# Patient Record
Sex: Male | Born: 1980 | Race: Black or African American | Hispanic: No | Marital: Single | Smoking: Current every day smoker
Health system: Southern US, Community
[De-identification: ages and names within clinical notes are randomized; demographics above are authoritative.]

## PROBLEM LIST (undated history)

## (undated) ENCOUNTER — Emergency Department: Payer: MEDICAID

## (undated) DIAGNOSIS — J45909 Unspecified asthma, uncomplicated: Secondary | ICD-10-CM

## (undated) HISTORY — PX: NO PAST SURGERIES: SHX2092

---

## 2011-02-28 ENCOUNTER — Emergency Department: Payer: Self-pay | Admitting: *Deleted

## 2011-05-25 ENCOUNTER — Emergency Department: Payer: Self-pay | Admitting: Emergency Medicine

## 2012-04-15 ENCOUNTER — Emergency Department: Payer: Self-pay | Admitting: Emergency Medicine

## 2012-11-13 ENCOUNTER — Emergency Department: Payer: Self-pay | Admitting: Emergency Medicine

## 2012-12-22 ENCOUNTER — Emergency Department: Payer: Self-pay | Admitting: Emergency Medicine

## 2013-06-08 ENCOUNTER — Emergency Department: Payer: Self-pay | Admitting: Emergency Medicine

## 2014-09-05 ENCOUNTER — Emergency Department
Admission: EM | Admit: 2014-09-05 | Discharge: 2014-09-05 | Disposition: A | Discharge status: Home / Self Care | Payer: Self-pay | Attending: Emergency Medicine | Admitting: Emergency Medicine

## 2014-09-05 ENCOUNTER — Emergency Department: Payer: Self-pay

## 2014-09-05 ENCOUNTER — Encounter: Payer: Self-pay | Admitting: Emergency Medicine

## 2014-09-05 DIAGNOSIS — Y9289 Other specified places as the place of occurrence of the external cause: Secondary | ICD-10-CM | POA: Insufficient documentation

## 2014-09-05 DIAGNOSIS — M25552 Pain in left hip: Secondary | ICD-10-CM

## 2014-09-05 DIAGNOSIS — W109XXA Fall (on) (from) unspecified stairs and steps, initial encounter: Secondary | ICD-10-CM | POA: Insufficient documentation

## 2014-09-05 DIAGNOSIS — M79605 Pain in left leg: Secondary | ICD-10-CM

## 2014-09-05 DIAGNOSIS — Z720 Tobacco use: Secondary | ICD-10-CM | POA: Insufficient documentation

## 2014-09-05 DIAGNOSIS — Y998 Other external cause status: Secondary | ICD-10-CM | POA: Insufficient documentation

## 2014-09-05 DIAGNOSIS — Y9389 Activity, other specified: Secondary | ICD-10-CM | POA: Insufficient documentation

## 2014-09-05 DIAGNOSIS — S79912A Unspecified injury of left hip, initial encounter: Secondary | ICD-10-CM | POA: Insufficient documentation

## 2014-09-05 MED ORDER — TIZANIDINE HCL 4 MG PO TABS: 4.0000 mg | ORAL_TABLET | Freq: Three times a day (TID) | ORAL | Status: AC

## 2014-09-05 MED ORDER — TRAMADOL HCL 50 MG PO TABS: 50.0000 mg | ORAL_TABLET | Freq: Four times a day (QID) | ORAL | Status: DC | PRN

## 2014-09-05 MED ORDER — KETOROLAC TROMETHAMINE 60 MG/2ML IM SOLN
60.0000 mg | Freq: Once | INTRAMUSCULAR | Status: AC
  Administered 2014-09-05: 60 mg via INTRAMUSCULAR
  Filled 2014-09-05: qty 2

## 2014-09-05 MED ORDER — KETOROLAC TROMETHAMINE 10 MG PO TABS: 10.0000 mg | ORAL_TABLET | Freq: Four times a day (QID) | ORAL | Status: DC | PRN

## 2014-09-05 NOTE — ED Provider Notes (Signed)
[facility] Emergency Department Provider Note ____________________________________________  Time seen: Approximately 9:00 AM  I have reviewed the triage vital signs and the nursing notes.   HISTORY  Chief Complaint Fall   HPI Ryan Beck is a 34 y.o. male who presents to the ER for evaluation of left hip and leg pain post fall. He slipped on a wet step while wearing flip flops. No loss of consciousness. Able to ambulate after the fall and drove himself here.  History reviewed. No pertinent past medical history.  There are no active problems to display for this patient.   History reviewed. No pertinent past surgical history.  No current outpatient prescriptions on file.  Allergies Shellfish allergy  No family history on file.  Social History Social History  Substance Use Topics  . Smoking status: Current Every Day Smoker  . Smokeless tobacco: None  . Alcohol Use: No    Review of Systems Constitutional: No recent illness. Eyes: No visual changes. ENT: No sore throat. Cardiovascular: Denies chest pain or palpitations. Respiratory: Denies shortness of breath. Gastrointestinal: No abdominal pain.  Genitourinary: Negative for dysuria. Musculoskeletal: Pain in left hip with radiation into the left lateral leg. Skin: Negative for rash. Neurological: Negative for headaches, focal weakness or numbness. 10-point ROS otherwise negative.  ____________________________________________   PHYSICAL EXAM:  VITAL SIGNS: ED Triage Vitals  Enc Vitals Group     BP 09/05/14 0833 164/108 mmHg     Pulse Rate 09/05/14 0833 77     Resp 09/05/14 0833 20     Temp 09/05/14 0833 98.4 F (36.9 C)     Temp Source 09/05/14 0833 Oral     SpO2 09/05/14 0833 98 %     Weight 09/05/14 0833 285 lb (129.275 kg)     Height 09/05/14 0833  (1.702 m)     Head Cir --      Peak Flow --      Pain Score 09/05/14 0834 8     Pain Loc --      Pain Edu? --    Excl. in GC? --     Constitutional: Alert and oriented. Well appearing and in no acute distress. Eyes: Conjunctivae are normal. EOMI. Head: Atraumatic. Nose: No congestion/rhinnorhea. Neck: No stridor.  Respiratory: Normal respiratory effort.   Musculoskeletal: left hip pain with any movement; no shortening or rotation. Neurologic:  Normal speech and language. No gross focal neurologic deficits are appreciated. Speech is normal. No gait instability. Skin:  Skin is warm, dry and intact. Atraumatic. Psychiatric: Mood and affect are normal. Speech and behavior are normal.  ____________________________________________   LABS (all labs ordered are listed, but only abnormal results are displayed)  Labs Reviewed - No data to display ____________________________________________  RADIOLOGY  Left hip negative for acute abnormality.  ____________________________________________   PROCEDURES  Procedure(s) performed: None   ____________________________________________   INITIAL IMPRESSION / ASSESSMENT AND PLAN / ED COURSE  Pertinent labs & imaging results that were available during my care of the patient were reviewed by me and considered in my medical decision making (see chart for details).  IM Toradol given in the ER with some relief. He is to follow up with orthopedics for symptoms that are not improving over the week or return to the ER for symptoms that change or worsen. ____________________________________________   FINAL CLINICAL IMPRESSION(S) / ED DIAGNOSES  Final diagnoses:  Hip pain, acute, left       Chinita Pester, FNP 09/05/14 1051  Sharyn Creamer, MD 09/05/14 [phone]

## 2014-09-05 NOTE — ED Notes (Signed)
Pt states he slipped in a puddle of water and fell this morning.the patient c/o left hip/leg/lower back pain.the patient is ambulatory to triage without difficulty

## 2014-09-05 NOTE — ED Notes (Signed)
States he slipped down steps  Having pain to left hip and back

## 2014-12-29 ENCOUNTER — Emergency Department
Admission: EM | Admit: 2014-12-29 | Discharge: 2014-12-29 | Disposition: A | Discharge status: Home / Self Care | Payer: Self-pay | Attending: Emergency Medicine | Admitting: Emergency Medicine

## 2014-12-29 ENCOUNTER — Emergency Department: Payer: Self-pay

## 2014-12-29 DIAGNOSIS — F17200 Nicotine dependence, unspecified, uncomplicated: Secondary | ICD-10-CM | POA: Insufficient documentation

## 2014-12-29 DIAGNOSIS — Z79899 Other long term (current) drug therapy: Secondary | ICD-10-CM | POA: Insufficient documentation

## 2014-12-29 DIAGNOSIS — J45901 Unspecified asthma with (acute) exacerbation: Secondary | ICD-10-CM | POA: Insufficient documentation

## 2014-12-29 HISTORY — DX: Unspecified asthma, uncomplicated: J45.909

## 2014-12-29 MED ORDER — PREDNISONE 20 MG PO TABS
60.0000 mg | ORAL_TABLET | Freq: Once | ORAL | Status: AC
  Administered 2014-12-29: 60 mg via ORAL

## 2014-12-29 MED ORDER — PREDNISONE 20 MG PO TABS: 60.0000 mg | ORAL_TABLET | Freq: Every day | ORAL | Status: AC

## 2014-12-29 MED ORDER — PREDNISONE 20 MG PO TABS
ORAL_TABLET | ORAL | Status: AC
  Administered 2014-12-29: 60 mg via ORAL
  Filled 2014-12-29: qty 3

## 2014-12-29 MED ORDER — IPRATROPIUM-ALBUTEROL 0.5-2.5 (3) MG/3ML IN SOLN
RESPIRATORY_TRACT | Status: AC
  Administered 2014-12-29: 3 mL
  Filled 2014-12-29: qty 3

## 2014-12-29 MED ORDER — IPRATROPIUM-ALBUTEROL 0.5-2.5 (3) MG/3ML IN SOLN
RESPIRATORY_TRACT | Status: AC
  Administered 2014-12-29: 3 mL via RESPIRATORY_TRACT
  Filled 2014-12-29: qty 6

## 2014-12-29 MED ORDER — ALBUTEROL SULFATE HFA 108 (90 BASE) MCG/ACT IN AERS: 2.0000 | INHALATION_SPRAY | Freq: Four times a day (QID) | RESPIRATORY_TRACT | Status: DC | PRN

## 2014-12-29 MED ORDER — IPRATROPIUM-ALBUTEROL 0.5-2.5 (3) MG/3ML IN SOLN
3.0000 mL | Freq: Once | RESPIRATORY_TRACT | Status: AC
  Administered 2014-12-29: 3 mL via RESPIRATORY_TRACT
  Filled 2014-12-29: qty 3

## 2014-12-29 NOTE — ED Notes (Signed)
Pt states hx of asthma, SOB with excertion, cough and congestion, pt SOB ambulating to room , 02 sat 97% on RA

## 2014-12-29 NOTE — ED Notes (Signed)
White phlegm, SOB, chest pain that began this AM. Pt alert and oriented X4, active, cooperative, pt in NAD. RR even and unlabored, color WNL.

## 2014-12-29 NOTE — ED Notes (Signed)
Pt refuses blood work at this time

## 2014-12-29 NOTE — Discharge Instructions (Signed)
Asthma, Adult °Asthma is a recurring condition in which the airways tighten and narrow. Asthma can make it difficult to breathe. It can cause coughing, wheezing, and shortness of breath. Asthma episodes, also called asthma attacks, range from minor to life-threatening. Asthma cannot be cured, but medicines and lifestyle changes can help control it. °CAUSES °Asthma is believed to be caused by inherited (genetic) and environmental factors, but its exact cause is unknown. Asthma may be triggered by allergens, lung infections, or irritants in the air. Asthma triggers are different for each person. Common triggers include:  °· Animal dander. °· Dust mites. °· Cockroaches. °· Pollen from trees or grass. °· Mold. °· Smoke. °· Air pollutants such as dust, household cleaners, hair sprays, aerosol sprays, paint fumes, strong chemicals, or strong odors. °· Cold air, weather changes, and winds (which increase molds and pollens in the air). °· Strong emotional expressions such as crying or laughing hard. °· Stress. °· Certain medicines (such as aspirin) or types of drugs (such as beta-blockers). °· Sulfites in foods and drinks. Foods and drinks that may contain sulfites include dried fruit, potato chips, and sparkling grape juice. °· Infections or inflammatory conditions such as the flu, a cold, or an inflammation of the nasal membranes (rhinitis). °· Gastroesophageal reflux disease (GERD). °· Exercise or strenuous activity. °SYMPTOMS °Symptoms may occur immediately after asthma is triggered or many hours later. Symptoms include: °· Wheezing. °· Excessive nighttime or early morning coughing. °· Frequent or severe coughing with a common cold. °· Chest tightness. °· Shortness of breath. °DIAGNOSIS  °The diagnosis of asthma is made by a review of your medical history and a physical exam. Tests may also be performed. These may include: °· Lung function studies. These tests show how much air you breathe in and out. °· Allergy  tests. °· Imaging tests such as X-rays. °TREATMENT  °Asthma cannot be cured, but it can usually be controlled. Treatment involves identifying and avoiding your asthma triggers. It also involves medicines. There are 2 classes of medicine used for asthma treatment:  °· Controller medicines. These prevent asthma symptoms from occurring. They are usually taken every day. °· Reliever or rescue medicines. These quickly relieve asthma symptoms. They are used as needed and provide short-term relief. °Your health care provider will help you create an asthma action plan. An asthma action plan is a written plan for managing and treating your asthma attacks. It includes a list of your asthma triggers and how they may be avoided. It also includes information on when medicines should be taken and when their dosage should be changed. An action plan may also involve the use of a device called a peak flow meter. A peak flow meter measures how well the lungs are working. It helps you monitor your condition. °HOME CARE INSTRUCTIONS  °· Take medicines only as directed by your health care provider. Speak with your health care provider if you have questions about how or when to take the medicines. °· Use a peak flow meter as directed by your health care provider. Record and keep track of readings. °· Understand and use the action plan to help minimize or stop an asthma attack without needing to seek medical care. °· Control your home environment in the following ways to help prevent asthma attacks: °· Do not smoke. Avoid being exposed to secondhand smoke. °· Change your heating and air conditioning filter regularly. °· Limit your use of fireplaces and wood stoves. °· Get rid of pests (such as roaches   and mice) and their droppings. °· Throw away plants if you see mold on them. °· Clean your floors and dust regularly. Use unscented cleaning products. °· Try to have someone else vacuum for you regularly. Stay out of rooms while they are  being vacuumed and for a short while afterward. If you vacuum, use a dust mask from a hardware store, a double-layered or microfilter vacuum cleaner bag, or a vacuum cleaner with a HEPA filter. °· Replace carpet with wood, tile, or vinyl flooring. Carpet can trap dander and dust. °· Use allergy-proof pillows, mattress covers, and box spring covers. °· Wash bed sheets and blankets every week in hot water and dry them in a dryer. °· Use blankets that are made of polyester or cotton. °· Clean bathrooms and kitchens with bleach. If possible, have someone repaint the walls in these rooms with mold-resistant paint. Keep out of the rooms that are being cleaned and painted. °· Wash hands frequently. °SEEK MEDICAL CARE IF:  °· You have wheezing, shortness of breath, or a cough even if taking medicine to prevent attacks. °· The colored mucus you cough up (sputum) is thicker than usual. °· Your sputum changes from clear or white to yellow, green, gray, or bloody. °· You have any problems that may be related to the medicines you are taking (such as a rash, itching, swelling, or trouble breathing). °· You are using a reliever medicine more than 2-3 times per week. °· Your peak flow is still at 50-79% of your personal best after following your action plan for 1 hour. °· You have a fever. °SEEK IMMEDIATE MEDICAL CARE IF:  °· You seem to be getting worse and are unresponsive to treatment during an asthma attack. °· You are short of breath even at rest. °· You get short of breath when doing very little physical activity. °· You have difficulty eating, drinking, or talking due to asthma symptoms. °· You develop chest pain. °· You develop a fast heartbeat. °· You have a bluish color to your lips or fingernails. °· You are light-headed, dizzy, or faint. °· Your peak flow is less than 50% of your personal best. °  °This information is not intended to replace advice given to you by your health care provider. Make sure you discuss any  questions you have with your health care provider. °  °Document Released: 12/20/2004 Document Revised: 09/10/2014 Document Reviewed: 07/19/2012 °Elsevier Interactive Patient Education ©2016 [employer] ° °Asthma Attack Prevention °While you may not be able to control the fact that you have asthma, you can take actions to prevent asthma attacks. The best way to prevent asthma attacks is to maintain good control of your asthma. You can achieve this by: °· Taking your medicines as directed. °· Avoiding things that can irritate your airways or make your asthma symptoms worse (asthma triggers). °· Keeping track of how well your asthma is controlled and of any changes in your symptoms. °· Responding quickly to worsening asthma symptoms (asthma attack). °· Seeking emergency care when it is needed. °WHAT ARE SOME WAYS TO PREVENT AN ASTHMA ATTACK? °Have a Plan °Work with your health care provider to create a written plan for managing and treating your asthma attacks (asthma action plan). This plan includes: °· A list of your asthma triggers and how you can avoid them. °· Information on when medicines should be taken and when their dosages should be changed. °· The use of a device that measures how well your lungs are   working (peak flow meter). °Monitor Your Asthma °Use your peak flow meter and record your results in a journal every day. A drop in your peak flow numbers on one or more days may indicate the start of an asthma attack. This can happen even before you start to feel symptoms. You can prevent an asthma attack from getting worse by following the steps in your asthma action plan. °Avoid Asthma Triggers °Work with your asthma health care provider to find out what your asthma triggers are. This can be done by: °· Allergy testing. °· Keeping a journal that notes when asthma attacks occur and the factors that may have contributed to them. °· Determining if there are other medical conditions that are making your asthma  worse. °Once you have determined your asthma triggers, take steps to avoid them. This may include avoiding excessive or prolonged exposure to: °· Dust. Have someone dust and vacuum your home for you once or twice a week. Using a high-efficiency particulate arrestance (HEPA) vacuum is best. °· Smoke. This includes campfire smoke, forest fire smoke, and secondhand smoke from tobacco products. °· Pet dander. Avoid contact with animals that you know you are allergic to. °· Allergens from trees, grasses or pollens. Avoid spending a lot of time outdoors when pollen counts are high, and on very windy days. °· Very cold, dry, or humid air. °· Mold. °· Foods that contain high amounts of sulfites. °· Strong odors. °· Outdoor air pollutants, such as engine exhaust. °· Indoor air pollutants, such as aerosol sprays and fumes from household cleaners. °· Household pests, including dust mites and cockroaches, and pest droppings. °· Certain medicines, including NSAIDs. Always talk to your health care provider before stopping or starting any new medicines. °Medicines °Take over-the-counter and prescription medicines only as told by your health care provider. Many asthma attacks can be prevented by carefully following your medicine schedule. Taking your medicines correctly is especially important when you cannot avoid certain asthma triggers. °Act Quickly °If an asthma attack does happen, acting quickly can decrease how severe it is and how long it lasts. Take these steps:  °· Pay attention to your symptoms. If you are coughing, wheezing, or having difficulty breathing, do not wait to see if your symptoms go away on their own. Follow your asthma action plan. °· If you have followed your asthma action plan and your symptoms are not improving, call your health care provider or seek immediate medical care at the nearest hospital. °It is important to note how often you need to use your fast-acting rescue inhaler. If you are using your  rescue inhaler more often, it may mean that your asthma is not under control. Adjusting your asthma treatment plan may help you to prevent future asthma attacks and help you to gain better control of your condition. °HOW CAN I PREVENT AN ASTHMA ATTACK WHEN I EXERCISE? °Follow advice from your health care provider about whether you should use your fast-acting inhaler before exercising. Many people with asthma experience exercise-induced bronchoconstriction (EIB). This condition often worsens during vigorous exercise in cold, humid, or dry environments. Usually, people with EIB can stay very active by pre-treating with a fast-acting inhaler before exercising. °  °This information is not intended to replace advice given to you by your health care provider. Make sure you discuss any questions you have with your health care provider. °  °Document Released: 12/08/2008 Document Revised: 09/10/2014 Document Reviewed: 05/22/2014 °Elsevier Interactive Patient Education ©2016 [employer] Inc[employer] ° °

## 2014-12-29 NOTE — ED Provider Notes (Signed)
[facility] Emergency Department Provider Note  ____________________________________________  Time seen: 1:00 PM  I have reviewed the triage vital signs and the nursing notes.   HISTORY  Chief Complaint Cough; Shortness of Breath; and Chest Pain     HPI Esteban Kobashigawa is a 34 y.o. male presents with cough wheezing and congestion times an day. Patient states that he has a history of asthma in addition patient also smokes approximately one pack's a respiratory day but is actively trying to quit. Patient denies any fever.    Past Medical History  Diagnosis Date  . Asthma     There are no active problems to display for this patient.   Past Surgical history None  Current Outpatient Rx  Name  Route  Sig  Dispense  Refill  . ketorolac (TORADOL) 10 MG tablet   Oral   Take 1 tablet (10 mg total) by mouth every 6 (six) hours as needed.   20 tablet   0   . tiZANidine (ZANAFLEX) 4 MG tablet   Oral   Take 1 tablet (4 mg total) by mouth 3 (three) times daily.   30 tablet   0   . traMADol (ULTRAM) 50 MG tablet   Oral   Take 1 tablet (50 mg total) by mouth every 6 (six) hours as needed.   9 tablet   0     Allergies Shellfish allergy  No family history on file.  Social History Social History  Substance Use Topics  . Smoking status: Current Every Day Smoker  . Smokeless tobacco: None  . Alcohol Use: No    Review of Systems  Constitutional: Negative for fever. Eyes: Negative for visual changes. ENT: Negative for sore throat. Cardiovascular: Negative for chest pain. Respiratory: Positive for wheezing shortness breath and cough Gastrointestinal: Negative for abdominal pain, vomiting and diarrhea. Genitourinary: Negative for dysuria. Musculoskeletal: Negative for back pain. Skin: Negative for rash. Neurological: Negative for headaches, focal weakness or numbness.   10-point ROS otherwise  negative.  ____________________________________________   PHYSICAL EXAM:  VITAL SIGNS: ED Triage Vitals  Enc Vitals Group     BP 12/29/14 1114 129/99 mmHg     Pulse Rate 12/29/14 1114 70     Resp 12/29/14 1114 20     Temp 12/29/14 1114 97.9 F (36.6 C)     Temp Source 12/29/14 1114 Oral     SpO2 12/29/14 1114 97 %     Weight 12/29/14 1114 295 lb (133.811 kg)     Height 12/29/14 1114  (1.676 m)     Head Cir --      Peak Flow --      Pain Score 12/29/14 1123 8     Pain Loc --      Pain Edu? --      Excl. in GC? --      Constitutional: Alert and oriented. Well appearing and in no distress. Eyes: Conjunctivae are normal. PERRL. Normal extraocular movements. ENT   Head: Normocephalic and atraumatic.   Nose: No congestion/rhinnorhea.   Mouth/Throat: Mucous membranes are moist.   Neck: No stridor. Hematological/Lymphatic/Immunilogical: No cervical lymphadenopathy. Cardiovascular: Normal rate, regular rhythm. Normal and symmetric distal pulses are present in all extremities. No murmurs, rubs, or gallops. Respiratory: Normal respiratory effort without tachypnea nor retractions. Breath sounds are clear and equal bilaterally. Mild expiratory wheezes Gastrointestinal: Soft and nontender. No distention. There is no CVA tenderness. Genitourinary: deferred Musculoskeletal: Nontender with normal range of motion in all extremities.  No joint effusions.  No lower extremity tenderness nor edema. Neurologic:  Normal speech and language. No gross focal neurologic deficits are appreciated. Speech is normal.  Skin:  Skin is warm, dry and intact. No rash noted. Psychiatric: Mood and affect are normal. Speech and behavior are normal. Patient exhibits appropriate insight and judgment.   ED ECG REPORT I, Kashena Novitski, [place], the attending physician, personally viewed and interpreted this ECG.   Date: 12/29/2014  EKG Time: 11:32 AM  Rate: 79  Rhythm: Normal Sinus Rhythm  Axis:  None  Intervals: Normal  ST&T Change: None    RADIOLOGY        DG Chest 2 View (Final result) Result time: 12/29/14 11:44:44   Final result by Rad Results In Interface (12/29/14 11:44:44)   Narrative:   CLINICAL DATA: Midsternal chest pain beginning today. Cough and congestion. Smoker. Initial encounter.  EXAM: CHEST 2 VIEW  COMPARISON: PA and lateral chest 06/08/2013 and 11/13/2012.  FINDINGS: The lungs are clear. Heart size is normal. No pneumothorax or pleural effusion. No focal bony abnormality.  IMPRESSION: Negative chest.   Electronically Signed By: Drusilla Kannerhomas Dalessio M.D. On: 12/29/2014 11:44           INITIAL IMPRESSION / ASSESSMENT AND PLAN / ED COURSE  Pertinent labs & imaging results that were available during my care of the patient were reviewed by me and considered in my medical decision making (see chart for details).  Patient received 2 DuoNeb's and prednisone 60 mg. Will be prescribed albuterol inhaler as well as prednisone at home.  ____________________________________________   FINAL CLINICAL IMPRESSION(S) / ED DIAGNOSES  Final diagnoses:  Acute asthma exacerbation, unspecified asthma severity      Darci Currentandolph N Keontae Levingston, MD 12/29/14 1318

## 2015-03-23 ENCOUNTER — Encounter: Payer: Self-pay | Admitting: Emergency Medicine

## 2015-03-23 ENCOUNTER — Emergency Department
Admission: EM | Admit: 2015-03-23 | Discharge: 2015-03-23 | Disposition: A | Discharge status: Home / Self Care | Payer: Self-pay | Attending: Emergency Medicine | Admitting: Emergency Medicine

## 2015-03-23 ENCOUNTER — Emergency Department: Payer: Self-pay

## 2015-03-23 DIAGNOSIS — F17210 Nicotine dependence, cigarettes, uncomplicated: Secondary | ICD-10-CM | POA: Insufficient documentation

## 2015-03-23 DIAGNOSIS — J45901 Unspecified asthma with (acute) exacerbation: Secondary | ICD-10-CM | POA: Insufficient documentation

## 2015-03-23 DIAGNOSIS — J209 Acute bronchitis, unspecified: Secondary | ICD-10-CM

## 2015-03-23 DIAGNOSIS — J45909 Unspecified asthma, uncomplicated: Secondary | ICD-10-CM

## 2015-03-23 LAB — RAPID INFLUENZA A&B ANTIGENS (ARMC ONLY): INFLUENZA A (ARMC): NEGATIVE

## 2015-03-23 LAB — RAPID INFLUENZA A&B ANTIGENS: Influenza B (ARMC): NEGATIVE

## 2015-03-23 MED ORDER — PREDNISONE 20 MG PO TABS
60.0000 mg | ORAL_TABLET | Freq: Once | ORAL | Status: AC
  Administered 2015-03-23: 60 mg via ORAL
  Filled 2015-03-23: qty 3

## 2015-03-23 MED ORDER — IPRATROPIUM-ALBUTEROL 0.5-2.5 (3) MG/3ML IN SOLN
3.0000 mL | Freq: Once | RESPIRATORY_TRACT | Status: AC
  Administered 2015-03-23: 3 mL via RESPIRATORY_TRACT

## 2015-03-23 MED ORDER — ALBUTEROL SULFATE HFA 108 (90 BASE) MCG/ACT IN AERS: 2.0000 | INHALATION_SPRAY | Freq: Four times a day (QID) | RESPIRATORY_TRACT | Status: DC | PRN

## 2015-03-23 MED ORDER — PREDNISONE 10 MG PO TABS: 50.0000 mg | ORAL_TABLET | Freq: Every day | ORAL | Status: DC

## 2015-03-23 MED ORDER — ALBUTEROL SULFATE (2.5 MG/3ML) 0.083% IN NEBU
2.5000 mg | INHALATION_SOLUTION | Freq: Once | RESPIRATORY_TRACT | Status: AC
  Administered 2015-03-23: 2.5 mg via RESPIRATORY_TRACT
  Filled 2015-03-23: qty 3

## 2015-03-23 MED ORDER — IPRATROPIUM-ALBUTEROL 0.5-2.5 (3) MG/3ML IN SOLN
RESPIRATORY_TRACT | Status: AC
  Administered 2015-03-23: 3 mL via RESPIRATORY_TRACT
  Filled 2015-03-23: qty 3

## 2015-03-23 NOTE — ED Notes (Signed)
Speaking full sentences but sounds tight. Nebulizer treatment given in triage.

## 2015-03-23 NOTE — ED Notes (Signed)
States feeling relief of tightness with breathing treatment. To flex wait.

## 2015-03-23 NOTE — Discharge Instructions (Signed)
Acute Bronchitis °Bronchitis is inflammation of the airways that extend from the windpipe into the lungs (bronchi). The inflammation often causes mucus to develop. This leads to a cough, which is the most common symptom of bronchitis.  °In acute bronchitis, the condition usually develops suddenly and goes away over time, usually in a couple weeks. Smoking, allergies, and asthma can make bronchitis worse. Repeated episodes of bronchitis may cause further lung problems.  °CAUSES °Acute bronchitis is most often caused by the same virus that causes a cold. The virus can spread from person to person (contagious) through coughing, sneezing, and touching contaminated objects. °SIGNS AND SYMPTOMS  °· Cough.   °· Fever.   °· Coughing up mucus.   °· Body aches.   °· Chest congestion.   °· Chills.   °· Shortness of breath.   °· Sore throat.   °DIAGNOSIS  °Acute bronchitis is usually diagnosed through a physical exam. Your health care provider will also ask you questions about your medical history. Tests, such as chest X-rays, are sometimes done to rule out other conditions.  °TREATMENT  °Acute bronchitis usually goes away in a couple weeks. Oftentimes, no medical treatment is necessary. Medicines are sometimes given for relief of fever or cough. Antibiotic medicines are usually not needed but may be prescribed in certain situations. In some cases, an inhaler may be recommended to help reduce shortness of breath and control the cough. A cool mist vaporizer may also be used to help thin bronchial secretions and make it easier to clear the chest.  °HOME CARE INSTRUCTIONS °· Get plenty of rest.   °· Drink enough fluids to keep your urine clear or pale yellow (unless you have a medical condition that requires fluid restriction). Increasing fluids may help thin your respiratory secretions (sputum) and reduce chest congestion, and it will prevent dehydration.   °· Take medicines only as directed by your health care provider. °· If  you were prescribed an antibiotic medicine, finish it all even if you start to feel better. °· Avoid smoking and secondhand smoke. Exposure to cigarette smoke or irritating chemicals will make bronchitis worse. If you are a smoker, consider using nicotine gum or skin patches to help control withdrawal symptoms. Quitting smoking will help your lungs heal faster.   °· Reduce the chances of another bout of acute bronchitis by washing your hands frequently, avoiding people with cold symptoms, and trying not to touch your hands to your mouth, nose, or eyes.   °· Keep all follow-up visits as directed by your health care provider.   °SEEK MEDICAL CARE IF: °Your symptoms do not improve after 1 week of treatment.  °SEEK IMMEDIATE MEDICAL CARE IF: °· You develop an increased fever or chills.   °· You have chest pain.   °· You have severe shortness of breath. °· You have bloody sputum.   °· You develop dehydration. °· You faint or repeatedly feel like you are going to pass out. °· You develop repeated vomiting. °· You develop a severe headache. °MAKE SURE YOU:  °· Understand these instructions. °· Will watch your condition. °· Will get help right away if you are not doing well or get worse. °  °This information is not intended to replace advice given to you by your health care provider. Make sure you discuss any questions you have with your health care provider. °  °Document Released: 01/28/2004 Document Revised: 01/10/2014 Document Reviewed: 06/12/2012 °Elsevier Interactive Patient Education ©2016 [employer] ° ° °Asthma, Acute Bronchospasm  ° ° °Acute bronchospasm caused by asthma is also referred to as an   asthma attack. Bronchospasm means your air passages become narrowed. The narrowing is caused by inflammation and tightening of the muscles in the air tubes (bronchi) in your lungs. This can make it hard to breathe or cause you to wheeze and cough.  °CAUSES  °Possible triggers are:  °Animal dander from the skin, hair, or  feathers of animals.  °Dust mites contained in house dust.  °Cockroaches.  °Pollen from trees or grass.  °Mold.  °Cigarette or tobacco smoke.  °Air pollutants such as dust, household cleaners, hair sprays, aerosol sprays, paint fumes, strong chemicals, or strong odors.  °Cold air or weather changes. Cold air may trigger inflammation. Winds increase molds and pollens in the air.  °Strong emotions such as crying or laughing hard.  °Stress.  °Certain medicines such as aspirin or beta-blockers.  °Sulfites in foods and drinks, such as dried fruits and wine.  °Infections or inflammatory conditions, such as a flu, cold, or inflammation of the nasal membranes (rhinitis).  °Gastroesophageal reflux disease (GERD). GERD is a condition where stomach acid backs up into your esophagus.  °Exercise or strenuous activity. °SIGNS AND SYMPTOMS  °Wheezing.  °Excessive coughing, particularly at night.  °Chest tightness.  °Shortness of breath. °DIAGNOSIS  °Your health care provider will ask you about your medical history and perform a physical exam. A chest X-ray or blood testing may be performed to look for other causes of your symptoms or other conditions that may have triggered your asthma attack.  °TREATMENT  °Treatment is aimed at reducing inflammation and opening up the airways in your lungs. Most asthma attacks are treated with inhaled medicines. These include quick relief or rescue medicines (such as bronchodilators) and controller medicines (such as inhaled corticosteroids). These medicines are sometimes given through an inhaler or a nebulizer. Systemic steroid medicine taken by mouth or given through an IV tube also can be used to reduce the inflammation when an attack is moderate or severe. Antibiotic medicines are only used if a bacterial infection is present.  °HOME CARE INSTRUCTIONS  °Rest.  °Drink plenty of liquids. This helps the mucus to remain thin and be easily coughed up. Only use caffeine in moderation and do not use  alcohol until you have recovered from your illness.  °Do not smoke. Avoid being exposed to secondhand smoke.  °You play a critical role in keeping yourself in good health. Avoid exposure to things that cause you to wheeze or to have breathing problems.  °Keep your medicines up-to-date and available. Carefully follow your health care provider's treatment plan.  °Take your medicine exactly as prescribed.  °When pollen or pollution is bad, keep windows closed and use an air conditioner or go to places with air conditioning.  °Asthma requires careful medical care. See your health care provider for a follow-up as advised. If you are more than [redacted] weeks pregnant and you were prescribed any new medicines, let your obstetrician know about the visit and how you are doing. Follow up with your health care provider as directed.  °After you have recovered from your asthma attack, make an appointment with your outpatient doctor to talk about ways to reduce the likelihood of future attacks. If you do not have a doctor who manages your asthma, make an appointment with a primary care doctor to discuss your asthma. °SEEK IMMEDIATE MEDICAL CARE IF:  °You are getting worse.  °You have trouble breathing. If severe, call your local emergency services (911 in the [place]  °You develop chest pain or discomfort.  °  You are vomiting.  °You are not able to keep fluids down.  °You are coughing up yellow, green, brown, or bloody sputum.  °You have a fever and your symptoms suddenly get worse.  °You have trouble swallowing. °MAKE SURE YOU:  °Understand these instructions.  °Will watch your condition.  °Will get help right away if you are not doing well or get worse. °This information is not intended to replace advice given to you by your health care provider. Make sure you discuss any questions you have with your health care provider.  °Document Released: 04/06/2006 Document Revised: 12/25/2012 Document Reviewed: 06/27/2012  °Elsevier Interactive  Patient Education ©2016 [employer].  ° °

## 2015-03-23 NOTE — ED Provider Notes (Signed)
[facility] Emergency Department Provider Note  ____________________________________________  Time seen: Approximately 6:28 PM  I have reviewed the triage vital signs and the nursing notes.   HISTORY  Chief Complaint Shortness of Breath   HPI Ryan Beck is a 35 y.o. male who presents to the emergency department for evaluation of dyspnea.He states he began to feel chilled and short of breath today, but family state he has been wheezing for about a week. He has a history of asthma, but has not been able to fill Rx for inhaler due to cost. Breathing treatment in triage provided substantial relief.   Past Medical History  Diagnosis Date  . Asthma     There are no active problems to display for this patient.   History reviewed. No pertinent past surgical history.  Current Outpatient Rx  Name  Route  Sig  Dispense  Refill  . albuterol (PROVENTIL HFA;VENTOLIN HFA) 108 (90 Base) MCG/ACT inhaler   Inhalation   Inhale 2 puffs into the lungs every 6 (six) hours as needed for wheezing or shortness of breath.   1 Inhaler   2   . ketorolac (TORADOL) 10 MG tablet   Oral   Take 1 tablet (10 mg total) by mouth every 6 (six) hours as needed.   20 tablet   0   . predniSONE (DELTASONE) 10 MG tablet   Oral   Take 5 tablets (50 mg total) by mouth daily.   25 tablet   0   . tiZANidine (ZANAFLEX) 4 MG tablet   Oral   Take 1 tablet (4 mg total) by mouth 3 (three) times daily.   30 tablet   0   . traMADol (ULTRAM) 50 MG tablet   Oral   Take 1 tablet (50 mg total) by mouth every 6 (six) hours as needed.   9 tablet   0     Allergies Shellfish allergy  No family history on file.  Social History Social History  Substance Use Topics  . Smoking status: Current Every Day Smoker -- 1.00 packs/day    Types: Cigarettes  . Smokeless tobacco: None  . Alcohol Use: No    Review of Systems Constitutional: No fever/ positive for chills ENT: No sore  throat. Cardiovascular: Denies chest pain. Respiratory: Positive for shortness of breath. Positive  for cough. Gastrointestinal: Negative for  abdominal pain. No nausea,  no vomiting.  No diarrhea.  Genitourinary: Negative for dysuria. Musculoskeletal: Positive for body aches Skin: Negative for rash. Neurological: Negative for headaches, Negative for focal weakness or numbness.   ____________________________________________   PHYSICAL EXAM:  VITAL SIGNS: ED Triage Vitals  Enc Vitals Group     BP 03/23/15 1749 120/62 mmHg     Pulse Rate 03/23/15 1749 98     Resp 03/23/15 1749 20     Temp 03/23/15 1749 98.9 F (37.2 C)     Temp Source 03/23/15 1749 Oral     SpO2 03/23/15 1749 97 %     Weight 03/23/15 1749 305 lb (138.347 kg)     Height 03/23/15 1749  (1.676 m)     Head Cir --      Peak Flow --      Pain Score 03/23/15 1750 0     Pain Loc --      Pain Edu? --      Excl. in GC? --     Constitutional: Alert and oriented. Well appearing and in no acute distress. Eyes: Conjunctivae  are normal. PERRL. EOMI Head: Atraumatic. Nose: No congestion/rhinnorhea. Mouth/Throat: Mucous membranes are moist.  Oropharynx without erythema. Neck: No stridor.  Lymphatic: No cervical lymphadenopathy. Cardiovascular: Normal rate, regular rhythm. Grossly normal heart sounds.  Good peripheral circulation. Respiratory: Normal respiratory effort.  No retractions. Expiratory wheezing noted throughout. Gastrointestinal: Soft and nontender. No distention. No abdominal bruits. No CVA tenderness. Musculoskeletal: No joint pain reported. Active ROM x 4 observed. Neurologic:  Normal speech and language. No gross focal neurologic deficits are appreciated. Speech is normal. No gait instability. Skin:  Skin is warm, dry and intact. No rash noted. Psychiatric: Mood and affect are normal. Speech and behavior are normal.  ____________________________________________   LABS (all labs ordered are  listed, but only abnormal results are displayed)  Labs Reviewed  RAPID INFLUENZA A&B ANTIGENS ([facility] ONLY)   ____________________________________________  EKG   ____________________________________________  RADIOLOGY  Chest x-ray negative for consolidation or infiltrate. No pleural effusion. Mild central airway thickening.I, Kem Boroughsari Jay Haskew, personally viewed and evaluated these images (plain radiographs) as part of my medical decision making, as well as reviewing the written report by the radiologist.   ____________________________________________   PROCEDURES  Procedure(s) performed: None  Critical Care performed: No  ____________________________________________   INITIAL IMPRESSION / ASSESSMENT AND PLAN / ED COURSE  Pertinent labs & imaging results that were available during my care of the patient were reviewed by me and considered in my medical decision making (see chart for details).   Likely asthma flare. Will treat with DuoNeb, prednisone, and send influenza swab.  ----------------------------------------- 7:21 PM on 03/23/2015 -----------------------------------------  Expiratory wheeze continues, but air movement has improved. We will await results of influenza testing prior to discharge.  ----------------------------------------- 7:58 PM on 03/23/2015 -----------------------------------------  Patient was given an albuterol treatment prior to discharge. He'll be given a prescription for prednisone as well as albuterol inhalers. He was given information for the medication management clinic, as he says that he is unable to afford inhalers. He was advised to return to the emergency department for symptoms that change or worsen if he is unable to schedule an appointment with a primary care provider.  ____________________________________________   FINAL CLINICAL IMPRESSION(S) / ED DIAGNOSES  Final diagnoses:  Bronchitis with asthma, acute       Chinita PesterCari B  Criss Bartles, FNP 03/23/15 1959  Minna AntisKevin Paduchowski, MD 03/23/15 2008

## 2015-03-23 NOTE — ED Notes (Signed)
States feeling SOB today. States does have history of asthma and does not have his inhaler.

## 2015-08-18 ENCOUNTER — Emergency Department: Payer: Self-pay

## 2015-08-18 ENCOUNTER — Emergency Department
Admission: EM | Admit: 2015-08-18 | Discharge: 2015-08-18 | Disposition: A | Discharge status: Home / Self Care | Payer: Self-pay | Attending: Emergency Medicine | Admitting: Emergency Medicine

## 2015-08-18 ENCOUNTER — Encounter: Payer: Self-pay | Admitting: Emergency Medicine

## 2015-08-18 DIAGNOSIS — R0602 Shortness of breath: Secondary | ICD-10-CM | POA: Insufficient documentation

## 2015-08-18 DIAGNOSIS — F17210 Nicotine dependence, cigarettes, uncomplicated: Secondary | ICD-10-CM | POA: Insufficient documentation

## 2015-08-18 DIAGNOSIS — R079 Chest pain, unspecified: Secondary | ICD-10-CM | POA: Insufficient documentation

## 2015-08-18 DIAGNOSIS — J45909 Unspecified asthma, uncomplicated: Secondary | ICD-10-CM | POA: Insufficient documentation

## 2015-08-18 DIAGNOSIS — Z5321 Procedure and treatment not carried out due to patient leaving prior to being seen by health care provider: Secondary | ICD-10-CM | POA: Insufficient documentation

## 2015-08-18 LAB — BASIC METABOLIC PANEL
ANION GAP: 6 (ref 5–15)
BUN: 17 mg/dL (ref 6–20)
CHLORIDE: 105 mmol/L (ref 101–111)
CO2: 28 mmol/L (ref 22–32)
Calcium: 9.2 mg/dL (ref 8.9–10.3)
Creatinine, Ser: 0.94 mg/dL (ref 0.61–1.24)
GFR calc non Af Amer: >60 mL/min (ref >60)
GLUCOSE: 123 mg/dL — AB (ref 65–99)
Potassium: 3.7 mmol/L (ref 3.5–5.1)
Sodium: 139 mmol/L (ref 135–145)

## 2015-08-18 LAB — CBC
HEMATOCRIT: 40.8 % (ref 40.0–52.0)
HEMOGLOBIN: 14.2 g/dL (ref 13.0–18.0)
MCH: 26.5 pg (ref 26.0–34.0)
MCHC: 34.8 g/dL (ref 32.0–36.0)
MCV: 76.1 fL — AB (ref 80.0–100.0)
Platelets: 312 10*3/uL (ref 150–440)
RBC: 5.36 MIL/uL (ref 4.40–5.90)
RDW: 14.1 % (ref 11.5–14.5)
WBC: 14.6 10*3/uL — ABNORMAL HIGH (ref 3.8–10.6)

## 2015-08-18 LAB — TROPONIN I

## 2015-08-18 NOTE — ED Triage Notes (Signed)
Pt presents with left sided chest pain with radiation down into left arm started today with some shortness of breath.

## 2015-08-18 NOTE — ED Notes (Signed)
Pt still in lobby   Pt alert and oriented.  No acute distress.  Skin warm and dry.  Family with pt.

## 2015-08-19 ENCOUNTER — Encounter: Payer: Self-pay | Admitting: *Deleted

## 2015-08-19 DIAGNOSIS — Z91013 Allergy to seafood: Secondary | ICD-10-CM | POA: Insufficient documentation

## 2015-08-19 DIAGNOSIS — Z79899 Other long term (current) drug therapy: Secondary | ICD-10-CM | POA: Insufficient documentation

## 2015-08-19 DIAGNOSIS — J45909 Unspecified asthma, uncomplicated: Secondary | ICD-10-CM | POA: Insufficient documentation

## 2015-08-19 DIAGNOSIS — F17210 Nicotine dependence, cigarettes, uncomplicated: Secondary | ICD-10-CM | POA: Insufficient documentation

## 2015-08-19 DIAGNOSIS — J209 Acute bronchitis, unspecified: Secondary | ICD-10-CM | POA: Insufficient documentation

## 2015-08-19 LAB — BASIC METABOLIC PANEL
ANION GAP: 9 (ref 5–15)
BUN: 19 mg/dL (ref 6–20)
CALCIUM: 8.9 mg/dL (ref 8.9–10.3)
CHLORIDE: 105 mmol/L (ref 101–111)
CO2: 25 mmol/L (ref 22–32)
Creatinine, Ser: 0.99 mg/dL (ref 0.61–1.24)
GFR calc Af Amer: >60 mL/min (ref >60)
GFR calc non Af Amer: >60 mL/min (ref >60)
GLUCOSE: 155 mg/dL — AB (ref 65–99)
POTASSIUM: 3.4 mmol/L — AB (ref 3.5–5.1)
Sodium: 139 mmol/L (ref 135–145)

## 2015-08-19 LAB — CBC
HEMATOCRIT: 39 % — AB (ref 40.0–52.0)
HEMOGLOBIN: 13.5 g/dL (ref 13.0–18.0)
MCH: 26.3 pg (ref 26.0–34.0)
MCHC: 34.6 g/dL (ref 32.0–36.0)
MCV: 76 fL — AB (ref 80.0–100.0)
Platelets: 287 10*3/uL (ref 150–440)
RBC: 5.13 MIL/uL (ref 4.40–5.90)
RDW: 13.9 % (ref 11.5–14.5)
WBC: 14.6 10*3/uL — ABNORMAL HIGH (ref 3.8–10.6)

## 2015-08-19 LAB — TROPONIN I: Troponin I: <0.03 ng/mL (ref <0.03)

## 2015-08-19 NOTE — ED Triage Notes (Signed)
Pt arrived to ED reporting his Left sided chest pain that he was in ED fro yesterday has not subsided. Pt left without being seen yesterday and returned today because he reports sharp pains remain in his chest and worsen when he coughs. Pt reports he has had a non productive cough for the past two days. Pt reports SOB after coughing. Pain in chest mo longer radiates to left arm. Pain does not increase with palpation of chest.

## 2015-08-19 NOTE — ED Notes (Signed)
PT refused wanting a repeat chest xray. Yesterday's resulted normal and pt verbalized he did not want another before seeing the doctor.

## 2015-08-20 ENCOUNTER — Emergency Department
Admission: EM | Admit: 2015-08-20 | Discharge: 2015-08-20 | Disposition: A | Discharge status: Home / Self Care | Payer: Self-pay | Attending: Emergency Medicine | Admitting: Emergency Medicine

## 2015-08-20 DIAGNOSIS — J209 Acute bronchitis, unspecified: Secondary | ICD-10-CM

## 2015-08-20 LAB — TROPONIN I

## 2015-08-20 MED ORDER — AZITHROMYCIN 500 MG PO TABS: 500.0000 mg | ORAL_TABLET | Freq: Every day | ORAL | 0 refills | Status: AC

## 2015-08-20 MED ORDER — BENZONATATE 100 MG PO CAPS: 100.0000 mg | ORAL_CAPSULE | Freq: Four times a day (QID) | ORAL | 0 refills | Status: AC | PRN

## 2015-08-20 MED ORDER — ALBUTEROL SULFATE HFA 108 (90 BASE) MCG/ACT IN AERS: 2.0000 | INHALATION_SPRAY | Freq: Four times a day (QID) | RESPIRATORY_TRACT | 2 refills | Status: DC | PRN

## 2015-08-20 MED ORDER — IPRATROPIUM-ALBUTEROL 0.5-2.5 (3) MG/3ML IN SOLN
3.0000 mL | Freq: Once | RESPIRATORY_TRACT | Status: AC
  Administered 2015-08-20: 3 mL via RESPIRATORY_TRACT
  Filled 2015-08-20: qty 3

## 2015-08-20 NOTE — ED Provider Notes (Signed)
[facility] Emergency Department Provider Note  ____________________________________________   None    (approximate)  I have reviewed the triage vital signs and the nursing notes.   HISTORY  Chief Complaint Chest Pain   HPI Ryan Beck is a 35 y.o. male history of asthma presents to the emergency department with nonproductive cough times couple days associated with wheezing and left-sided chest discomfort. Patient denies any fever afebrile on presentation with temperature 98.5. Patient does admit to daily cigarette smoking. Patient denies any lower external pain or swelling.  Past Medical History:  Diagnosis Date  . Asthma     There are no active problems to display for this patient.   Past surgical history None  Prior to Admission medications   Medication Sig Start Date End Date Taking? Authorizing Provider  albuterol (PROVENTIL HFA;VENTOLIN HFA) 108 (90 Base) MCG/ACT inhaler Inhale 2 puffs into the lungs every 6 (six) hours as needed for wheezing or shortness of breath. 03/23/15   Cari B Triplett, FNP  ketorolac (TORADOL) 10 MG tablet Take 1 tablet (10 mg total) by mouth every 6 (six) hours as needed. 09/05/14   Chinita Pesterari B Triplett, FNP  predniSONE (DELTASONE) 10 MG tablet Take 5 tablets (50 mg total) by mouth daily. 03/23/15   Chinita Pesterari B Triplett, FNP  tiZANidine (ZANAFLEX) 4 MG tablet Take 1 tablet (4 mg total) by mouth 3 (three) times daily. 09/05/14 09/05/15  Chinita Pesterari B Triplett, FNP  traMADol (ULTRAM) 50 MG tablet Take 1 tablet (50 mg total) by mouth every 6 (six) hours as needed. 09/05/14   Chinita Pesterari B Triplett, FNP    Allergies Shellfish allergy  No family history on file.  Social History Social History  Substance Use Topics  . Smoking status: Current Every Day Smoker    Packs/day: 0.50    Types: Cigarettes  . Smokeless tobacco: Never Used  . Alcohol use No    Review of Systems Constitutional: No fever/chills Eyes: No visual changes. ENT: No sore  throat. Cardiovascular: Positive chest pain. Respiratory: Positive for cough Gastrointestinal: No abdominal pain.  No nausea, no vomiting.  No diarrhea.  No constipation. Genitourinary: Negative for dysuria. Musculoskeletal: Negative for back pain. Skin: Negative for rash. Neurological: Negative for headaches, focal weakness or numbness.  10-point ROS otherwise negative.  ____________________________________________   PHYSICAL EXAM:  VITAL SIGNS: ED Triage Vitals  Enc Vitals Group     BP 08/19/15 2200 (!) 151/95     Pulse Rate 08/19/15 2159 71     Resp 08/19/15 2159 18     Temp 08/19/15 2159 98.5 F (36.9 C)     Temp Source 08/19/15 2159 Oral     SpO2 08/19/15 2159 96 %     Weight 08/19/15 2200 292 lb (132.5 kg)     Height 08/19/15 2200 5\' 6"  (1.676 m)     Head Circumference --      Peak Flow --      Pain Score 08/19/15 2200 7     Pain Loc --      Pain Edu? --      Excl. in GC? --     Constitutional: Alert and oriented. Well appearing and in no acute distress. Eyes: Conjunctivae are normal. PERRL. EOMI. Head: Atraumatic. Ears:  Healthy appearing ear canals and TMs bilaterally Nose: No congestion/rhinnorhea. Mouth/Throat: Mucous membranes are moist.  Oropharynx non-erythematous. Neck: No stridor.  No meningeal signs.  Cardiovascular: Normal rate, regular rhythm. Good peripheral circulation. Grossly normal heart sounds.  Respiratory: Normal respiratory effort.  No retractions. Lungs CTAB. Gastrointestinal: Soft and nontender. No distention.  Musculoskeletal: No lower extremity tenderness nor edema. No gross deformities of extremities. Neurologic:  Normal speech and language. No gross focal neurologic deficits are appreciated.  Skin:  Skin is warm, dry and intact. No rash noted. Psychiatric: Mood and affect are normal. Speech and behavior are normal.  ____________________________________________   LABS (all labs ordered are listed, but only abnormal results are  displayed)  Labs Reviewed  BASIC METABOLIC PANEL - Abnormal; Notable for the following:       Result Value   Potassium 3.4 (*)    Glucose, Bld 155 (*)    All other components within normal limits  CBC - Abnormal; Notable for the following:    WBC 14.6 (*)    HCT 39.0 (*)    MCV 76.0 (*)    All other components within normal limits  TROPONIN I  TROPONIN I   ____________________________________________  EKG  ED ECG REPORT I, Mallard N BROWN, the attending physician, personally viewed and interpreted this ECG.   Date: 08/20/2015  EKG Time: 9:57PM  Rate: 82  Rhythm: Normal sinus rhythm  Axis: Normal  Intervals: Normal  ST&T Change: None  ____________________________________________  RADIOLOGY I, Hobucken N BROWN, personally viewed and evaluated these images (plain radiographs) as part of my medical decision making, as well as reviewing the written report by the radiologist.  No results found.  ____________________________________________   Procedures      INITIAL IMPRESSION / ASSESSMENT AND PLAN / ED COURSE  Pertinent labs & imaging results that were available during my care of the patient were reviewed by me and considered in my medical decision making (see chart for details).  Troponin negative 2 EKG revealed no evidence of cardiac ischemia or infarction. History of physical exam consistent with bronchitis  Clinical Course    ____________________________________________  FINAL CLINICAL IMPRESSION(S) / ED DIAGNOSES  Final diagnoses:  Acute bronchitis, unspecified organism     MEDICATIONS GIVEN DURING THIS VISIT:  Medications  ipratropium-albuterol (DUONEB) 0.5-2.5 (3) MG/3ML nebulizer solution 3 mL (3 mLs Nebulization Given 08/20/15 0051)     NEW OUTPATIENT MEDICATIONS STARTED DURING THIS VISIT:  New Prescriptions   No medications on file      Note:  This document was prepared using Dragon voice recognition software and may include  unintentional dictation errors.    Darci Currentandolph N Brown, MD 08/20/15 [phone]

## 2015-08-20 NOTE — ED Notes (Signed)
Discharge instructions reviewed with patient. Patient verbalized understanding. Patient ambulated to lobby without difficulty.   

## 2017-07-03 ENCOUNTER — Emergency Department: Payer: No Typology Code available for payment source

## 2017-07-03 ENCOUNTER — Emergency Department
Admission: EM | Admit: 2017-07-03 | Discharge: 2017-07-03 | Disposition: A | Discharge status: Home / Self Care | Payer: No Typology Code available for payment source | Attending: Emergency Medicine | Admitting: Emergency Medicine

## 2017-07-03 ENCOUNTER — Other Ambulatory Visit: Payer: Self-pay

## 2017-07-03 DIAGNOSIS — S39012A Strain of muscle, fascia and tendon of lower back, initial encounter: Secondary | ICD-10-CM | POA: Insufficient documentation

## 2017-07-03 DIAGNOSIS — S199XXA Unspecified injury of neck, initial encounter: Secondary | ICD-10-CM | POA: Diagnosis present

## 2017-07-03 DIAGNOSIS — Y9389 Activity, other specified: Secondary | ICD-10-CM | POA: Insufficient documentation

## 2017-07-03 DIAGNOSIS — Y998 Other external cause status: Secondary | ICD-10-CM | POA: Diagnosis not present

## 2017-07-03 DIAGNOSIS — J45909 Unspecified asthma, uncomplicated: Secondary | ICD-10-CM | POA: Insufficient documentation

## 2017-07-03 DIAGNOSIS — Y92411 Interstate highway as the place of occurrence of the external cause: Secondary | ICD-10-CM | POA: Diagnosis not present

## 2017-07-03 DIAGNOSIS — F17210 Nicotine dependence, cigarettes, uncomplicated: Secondary | ICD-10-CM | POA: Insufficient documentation

## 2017-07-03 DIAGNOSIS — S161XXA Strain of muscle, fascia and tendon at neck level, initial encounter: Secondary | ICD-10-CM | POA: Diagnosis not present

## 2017-07-03 MED ORDER — MELOXICAM 15 MG PO TABS: 15.0000 mg | ORAL_TABLET | Freq: Every day | ORAL | 2 refills | Status: AC

## 2017-07-03 MED ORDER — BACLOFEN 10 MG PO TABS: 10.0000 mg | ORAL_TABLET | Freq: Every day | ORAL | 1 refills | Status: AC

## 2017-07-03 MED ORDER — HYDROCODONE-ACETAMINOPHEN 5-325 MG PO TABS
1.0000 | ORAL_TABLET | Freq: Once | ORAL | Status: AC
  Administered 2017-07-03: 1 via ORAL
  Filled 2017-07-03: qty 1
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
## 2017-07-03 NOTE — ED Notes (Signed)
Pt alert and oriented X4, active, cooperative, pt in NAD. RR even and unlabored, color WNL.  Pt informed to return if any life threatening symptoms occur.  Discharge and followup instructions reviewed.  
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
## 2017-07-03 NOTE — Discharge Instructions (Signed)
Follow-up with your regular doctor if not better in 5 7 days.  Call orthopedics if your back is worsening.  Take the medication as prescribed.  Apply ice to all areas that hurt.  In 3 days she may switch to wet heat followed by ice.  Return to the emergency department if headache is worsening

## 2018-10-21 ENCOUNTER — Inpatient Hospital Stay
Admission: EM | Admit: 2018-10-21 | Discharge: 2018-10-23 | DRG: 193 | Discharge status: Against Medical Advice | Payer: Medicaid Other | Attending: Internal Medicine | Admitting: Internal Medicine

## 2018-10-21 ENCOUNTER — Other Ambulatory Visit: Payer: Self-pay

## 2018-10-21 ENCOUNTER — Emergency Department: Payer: Medicaid Other

## 2018-10-21 DIAGNOSIS — F17210 Nicotine dependence, cigarettes, uncomplicated: Secondary | ICD-10-CM | POA: Diagnosis present

## 2018-10-21 DIAGNOSIS — J9601 Acute respiratory failure with hypoxia: Secondary | ICD-10-CM | POA: Diagnosis present

## 2018-10-21 DIAGNOSIS — Z8249 Family history of ischemic heart disease and other diseases of the circulatory system: Secondary | ICD-10-CM

## 2018-10-21 DIAGNOSIS — Z91013 Allergy to seafood: Secondary | ICD-10-CM

## 2018-10-21 DIAGNOSIS — J189 Pneumonia, unspecified organism: Secondary | ICD-10-CM

## 2018-10-21 DIAGNOSIS — Z20828 Contact with and (suspected) exposure to other viral communicable diseases: Secondary | ICD-10-CM | POA: Diagnosis present

## 2018-10-21 DIAGNOSIS — E119 Type 2 diabetes mellitus without complications: Secondary | ICD-10-CM | POA: Diagnosis present

## 2018-10-21 DIAGNOSIS — J45909 Unspecified asthma, uncomplicated: Secondary | ICD-10-CM | POA: Diagnosis present

## 2018-10-21 DIAGNOSIS — A419 Sepsis, unspecified organism: Secondary | ICD-10-CM | POA: Diagnosis present

## 2018-10-21 DIAGNOSIS — Z6841 Body Mass Index (BMI) 40.0 and over, adult: Secondary | ICD-10-CM

## 2018-10-21 LAB — CBC WITH DIFFERENTIAL/PLATELET
Abs Immature Granulocytes: 0.18 10*3/uL — ABNORMAL HIGH (ref 0.00–0.07)
Basophils Absolute: 0.1 10*3/uL (ref 0.0–0.1)
Basophils Relative: 0 %
Eosinophils Absolute: 0 10*3/uL (ref 0.0–0.5)
Eosinophils Relative: 0 %
HCT: 35.7 % — ABNORMAL LOW (ref 39.0–52.0)
Hemoglobin: 12.7 g/dL — ABNORMAL LOW (ref 13.0–17.0)
Immature Granulocytes: 1 %
Lymphocytes Relative: 13 %
Lymphs Abs: 3.1 10*3/uL (ref 0.7–4.0)
MCH: 26.5 pg (ref 26.0–34.0)
MCHC: 35.6 g/dL (ref 30.0–36.0)
MCV: 74.4 fL — ABNORMAL LOW (ref 80.0–100.0)
Monocytes Absolute: 2.2 10*3/uL — ABNORMAL HIGH (ref 0.1–1.0)
Monocytes Relative: 9 %
Neutro Abs: 17.9 10*3/uL — ABNORMAL HIGH (ref 1.7–7.7)
Neutrophils Relative %: 77 %
Platelets: 281 10*3/uL (ref 150–400)
RBC: 4.8 MIL/uL (ref 4.22–5.81)
RDW: 13.2 % (ref 11.5–15.5)
WBC: 23.5 10*3/uL — ABNORMAL HIGH (ref 4.0–10.5)
nRBC: 0 % (ref 0.0–0.2)

## 2018-10-21 LAB — URINALYSIS, ROUTINE W REFLEX MICROSCOPIC
Bacteria, UA: NONE SEEN
Bilirubin Urine: NEGATIVE
Glucose, UA: NEGATIVE mg/dL
Ketones, ur: 20 mg/dL — AB
Leukocytes,Ua: NEGATIVE
Nitrite: NEGATIVE
Protein, ur: 100 mg/dL — AB
Specific Gravity, Urine: 1.027 (ref 1.005–1.030)
pH: 5 (ref 5.0–8.0)

## 2018-10-21 LAB — LACTIC ACID, PLASMA: Lactic Acid, Venous: 1.4 mmol/L (ref 0.5–1.9)

## 2018-10-21 MED ORDER — DOXYCYCLINE HYCLATE 100 MG PO TABS
100.0000 mg | ORAL_TABLET | Freq: Once | ORAL | Status: AC
  Administered 2018-10-21: 100 mg via ORAL
  Filled 2018-10-21: qty 1

## 2018-10-21 MED ORDER — SODIUM CHLORIDE 0.9 % IV BOLUS
1000.0000 mL | Freq: Once | INTRAVENOUS | Status: AC
  Administered 2018-10-21: 23:00:00 1000 mL via INTRAVENOUS

## 2018-10-21 MED ORDER — SODIUM CHLORIDE 0.9 % IV SOLN
1.0000 g | Freq: Once | INTRAVENOUS | Status: AC
  Administered 2018-10-22: 1 g via INTRAVENOUS
  Filled 2018-10-21: qty 10

## 2018-10-21 MED ORDER — METHYLPREDNISOLONE SODIUM SUCC 125 MG IJ SOLR
125.0000 mg | Freq: Once | INTRAMUSCULAR | Status: AC
  Administered 2018-10-21: 23:00:00 125 mg via INTRAVENOUS
  Filled 2018-10-21: qty 2

## 2018-10-21 MED ORDER — ACETAMINOPHEN 500 MG PO TABS
1000.0000 mg | ORAL_TABLET | Freq: Once | ORAL | Status: AC
  Administered 2018-10-21: 1000 mg via ORAL
  Filled 2018-10-21: qty 2

## 2018-10-21 NOTE — ED Triage Notes (Signed)
Patient presents to ED with general malaise and lightheadedness. Patient states he has felt bad for about two days. Thought it "was my asthma but now I don't think so." Has no particular symptoms but says he just doesn't feel good.

## 2018-10-21 NOTE — ED Notes (Signed)
Pt to stat registration with steady gait; says he feels "woozy"; asked pt to explain but he was unable; prodded with questions regarding symptoms and finally pt settled on "dizzy"; talking in complete coherent sentences;

## 2018-10-21 NOTE — ED Provider Notes (Signed)
[facility] Emergency Department Provider Note  ____________________________________________   First MD Initiated Contact with Patient 10/21/18 2213     (approximate)  I have reviewed the triage vital signs and the nursing notes.   HISTORY  Chief Complaint Dizziness    HPI Ryan Beck is a 39 y.o. male with asthma who presents with feeling woozy.  Patient says he is been feeling fatigued.  His symptoms been on for 2 days now.  He says that he has been feeling fatigued that is severe, constant, nothing makes better, nothing makes it worse.  He says that he just feels lightheaded like he is going to pass out but has not had any LOC.  Denies any neck stiffness, headaches, abdominal pain, urinary symptoms, rashes or wounds.  He does feel little bit of shortness of breath.  He says he was exposed to some fumes yesterday while working and thinks that help flared it up.  Denies any known coronavirus contacts.    Past Medical History:  Diagnosis Date  . Asthma     There are no active problems to display for this patient.   History reviewed. No pertinent surgical history.  Prior to Admission medications   Medication Sig Start Date End Date Taking? Authorizing Provider  albuterol (PROVENTIL HFA;VENTOLIN HFA) 108 (90 Base) MCG/ACT inhaler Inhale 2 puffs into the lungs every 6 (six) hours as needed for wheezing or shortness of breath. 03/23/15   Triplett, Cari B, FNP  albuterol (PROVENTIL HFA;VENTOLIN HFA) 108 (90 Base) MCG/ACT inhaler Inhale 2 puffs into the lungs every 6 (six) hours as needed for wheezing or shortness of breath. 08/20/15   Darci Current, MD    Allergies Shellfish allergy  No family history on file.  Social History Social History   Tobacco Use  . Smoking status: Current Every Day Smoker    Packs/day: 0.50    Types: Cigarettes  . Smokeless tobacco: Never Used  Substance Use Topics  . Alcohol use: No  . Drug use: No       Review of Systems Constitutional: Positive fever, fatigue Eyes: No visual changes. ENT: No sore throat. Cardiovascular: Denies chest pain. Respiratory: Positive shortness of breath Gastrointestinal: No abdominal pain.  No nausea, no vomiting.  No diarrhea.  No constipation. Genitourinary: Negative for dysuria. Musculoskeletal: Negative for back pain. Skin: Negative for rash. Neurological: Negative for headaches, focal weakness or numbness. All other ROS negative ____________________________________________   PHYSICAL EXAM:  VITAL SIGNS: ED Triage Vitals  Enc Vitals Group     BP 10/21/18 2030 (!) 116/59     Pulse Rate 10/21/18 2030 (!) 115     Resp 10/21/18 2030 18     Temp 10/21/18 2030 (!) 101.7 F (38.7 C)     Temp Source 10/21/18 2030 Oral     SpO2 --      Weight 10/21/18 2031 (!) 318 lb (144.2 kg)     Height 10/21/18 2031 5\' 7"  (1.702 m)     Head Circumference --      Peak Flow --      Pain Score 10/21/18 2029 0     Pain Loc --      Pain Edu? --      Excl. in GC? --     Constitutional: Alert and oriented. Well appearing and in no acute distress. Eyes: Conjunctivae are normal. EOMI. Head: Atraumatic. Nose: No congestion/rhinnorhea. Mouth/Throat: Mucous membranes are moist.   Neck: No stridor. Trachea Midline. FROM Cardiovascular: tachyCardiac,  regular rhythm. Grossly normal heart sounds.  Good peripheral circulation. Respiratory: Normal respiratory effort.  No retractions.  Maybe one wheeze on the left side? But not overtly wheezing.  Gastrointestinal: Soft and nontender. No distention. No abdominal bruits.  Musculoskeletal: No lower extremity tenderness nor edema.  No joint effusions. Neurologic:  Normal speech and language. No gross focal neurologic deficits are appreciated.  Skin:  Skin is warm, dry and intact. No rash noted. Psychiatric: Mood and affect are normal. Speech and behavior are normal. GU: Deferred   ____________________________________________    LABS (all labs ordered are listed, but only abnormal results are displayed)  Labs Reviewed  CULTURE, BLOOD (ROUTINE X 2)  CULTURE, BLOOD (ROUTINE X 2)  SARS CORONAVIRUS 2 BY RT PCR (HOSPITAL ORDER, [place])  CBC WITH DIFFERENTIAL/PLATELET  COMPREHENSIVE METABOLIC PANEL  LACTIC ACID, PLASMA  LACTIC ACID, PLASMA  URINALYSIS, ROUTINE W REFLEX MICROSCOPIC  TROPONIN I (HIGH SENSITIVITY)   ____________________________________________   ED ECG REPORT I, Vanessa [place], the attending physician, personally viewed and interpreted this ECG.  EKG sinus tachycardia rate of 101, no ST elevation, no T wave inversion, normal intervals ____________________________________________  RADIOLOGY Robert Bellow, personally viewed and evaluated these images (plain radiographs) as part of my medical decision making, as well as reviewing the written report by the radiologist.  ED MD interpretation: X-ray concerning for left upper lobe pneumonia  Official radiology report(s): Dg Chest Portable 1 View  Result Date: 10/21/2018 CLINICAL DATA:  Fever EXAM: PORTABLE CHEST 1 VIEW COMPARISON:  August 18, 2015 FINDINGS: The heart size and mediastinal contours are within normal limits. There is patchy airspace consolidation seen within the left upper lung. The right lung is clear. No acute osseous abnormality. IMPRESSION: Patchy area of consolidation within the left upper lung, likely consistent with pneumonia. Electronically Signed   By: Prudencio Pair M.D.   On: 10/21/2018 22:54    ____________________________________________   PROCEDURES  Procedure(s) performed (including Critical Care):  Procedures   ____________________________________________   INITIAL IMPRESSION / ASSESSMENT AND PLAN / ED COURSE  Ryan Beck was evaluated in Emergency Department on 10/21/2018 for the symptoms described in the history of present illness. He was evaluated in the context of the  global COVID-19 pandemic, which necessitated consideration that the patient might be at risk for infection with the SARS-CoV-2 virus that causes COVID-19. Institutional protocols and algorithms that pertain to the evaluation of patients at risk for COVID-19 are in a state of rapid change based on information released by regulatory bodies including the CDC and federal and state organizations. These policies and algorithms were followed during the patient's care in the ED.    Patient is a 38 year old who presents febrile, tachycardic with some shortness of breath and fatigue.  I suspect this is most likely a viral process such as coronavirus.  Will get testing be is if negative patient would need DuoNebs for his asthma.  Chest x-ray evaluate for pneumonia, urine evaluate for UTI.  Low suspicion for bacteremia given no risk factors but will get blood cultures.  Will give some fluids Tylenol and steroids for the asthma.   X-ray consistent with pneumonia.  Given we doubt the source will cover with ceftriaxone and doxy.   Patient handed off to oncoming team pending labs and reevaluation.  If covid negative can give some duonebs. Patient will need ambulatory saturation and if labs are normal and saturation levels are normal consider discharge home on doxycycline.  However if work-up is concerning,  patient is desatting, or concerns for pt not doing well outpatient patient would need to be admitted for pneumonia.   ____________________________________________   FINAL CLINICAL IMPRESSION(S) / ED DIAGNOSES   Final diagnoses:  Community acquired pneumonia of left upper lobe of lung      MEDICATIONS GIVEN DURING THIS VISIT:  Medications  cefTRIAXone (ROCEPHIN) 1 g in sodium chloride 0.9 % 100 mL IVPB (has no administration in time range)  doxycycline (VIBRA-TABS) tablet 100 mg (has no administration in time range)  sodium chloride 0.9 % bolus 1,000 mL (1,000 mLs Intravenous New Bag/Given 10/21/18  2254)  acetaminophen (TYLENOL) tablet 1,000 mg (1,000 mg Oral Given 10/21/18 2255)  methylPREDNISolone sodium succinate (SOLU-MEDROL) 125 mg/2 mL injection 125 mg (125 mg Intravenous Given 10/21/18 2256)     ED Discharge Orders    None       Note:  This document was prepared using Dragon voice recognition software and may include unintentional dictation errors.   Concha SeFunke, Amna Welker E, MD 10/21/18 [phone]

## 2018-10-21 NOTE — ED Notes (Signed)
Pt asleep when this RN entered room. Pt easily awakened. Pt states he is weak and dizzy.

## 2018-10-22 ENCOUNTER — Encounter: Payer: Self-pay | Admitting: Internal Medicine

## 2018-10-22 DIAGNOSIS — F17210 Nicotine dependence, cigarettes, uncomplicated: Secondary | ICD-10-CM | POA: Diagnosis present

## 2018-10-22 DIAGNOSIS — Z8249 Family history of ischemic heart disease and other diseases of the circulatory system: Secondary | ICD-10-CM | POA: Diagnosis not present

## 2018-10-22 DIAGNOSIS — E119 Type 2 diabetes mellitus without complications: Secondary | ICD-10-CM | POA: Diagnosis present

## 2018-10-22 DIAGNOSIS — J9601 Acute respiratory failure with hypoxia: Secondary | ICD-10-CM | POA: Diagnosis not present

## 2018-10-22 DIAGNOSIS — J189 Pneumonia, unspecified organism: Secondary | ICD-10-CM | POA: Diagnosis not present

## 2018-10-22 DIAGNOSIS — J45909 Unspecified asthma, uncomplicated: Secondary | ICD-10-CM | POA: Diagnosis present

## 2018-10-22 DIAGNOSIS — Z6841 Body Mass Index (BMI) 40.0 and over, adult: Secondary | ICD-10-CM | POA: Diagnosis not present

## 2018-10-22 DIAGNOSIS — A419 Sepsis, unspecified organism: Secondary | ICD-10-CM | POA: Diagnosis present

## 2018-10-22 DIAGNOSIS — Z20828 Contact with and (suspected) exposure to other viral communicable diseases: Secondary | ICD-10-CM | POA: Diagnosis present

## 2018-10-22 DIAGNOSIS — Z91013 Allergy to seafood: Secondary | ICD-10-CM | POA: Diagnosis not present

## 2018-10-22 LAB — COMPREHENSIVE METABOLIC PANEL
ALT: 27 U/L (ref 0–44)
AST: 31 U/L (ref 15–41)
Albumin: 3.8 g/dL (ref 3.5–5.0)
Alkaline Phosphatase: 38 U/L (ref 38–126)
Anion gap: 12 (ref 5–15)
BUN: 14 mg/dL (ref 6–20)
CO2: 21 mmol/L — ABNORMAL LOW (ref 22–32)
Calcium: 8.9 mg/dL (ref 8.9–10.3)
Chloride: 100 mmol/L (ref 98–111)
Creatinine, Ser: 1.21 mg/dL (ref 0.61–1.24)
GFR calc Af Amer: >60 mL/min (ref >60)
GFR calc non Af Amer: >60 mL/min (ref >60)
Glucose, Bld: 161 mg/dL — ABNORMAL HIGH (ref 70–99)
Potassium: 3.8 mmol/L (ref 3.5–5.1)
Sodium: 133 mmol/L — ABNORMAL LOW (ref 135–145)
Total Bilirubin: 0.9 mg/dL (ref 0.3–1.2)
Total Protein: 8.2 g/dL — ABNORMAL HIGH (ref 6.5–8.1)

## 2018-10-22 LAB — LACTIC ACID, PLASMA: Lactic Acid, Venous: 1.5 mmol/L (ref 0.5–1.9)

## 2018-10-22 LAB — SARS CORONAVIRUS 2 BY RT PCR (HOSPITAL ORDER, PERFORMED IN ~~LOC~~ HOSPITAL LAB): SARS Coronavirus 2: NEGATIVE

## 2018-10-22 LAB — TROPONIN I (HIGH SENSITIVITY)
Troponin I (High Sensitivity): 10 ng/L (ref <18)
Troponin I (High Sensitivity): 10 ng/L (ref <18)

## 2018-10-22 LAB — TSH: TSH: 0.297 u[IU]/mL — ABNORMAL LOW (ref 0.350–4.500)

## 2018-10-22 LAB — HIV ANTIBODY (ROUTINE TESTING W REFLEX): HIV Screen 4th Generation wRfx: NONREACTIVE

## 2018-10-22 MED ORDER — ENOXAPARIN SODIUM 40 MG/0.4ML ~~LOC~~ SOLN
40.0000 mg | Freq: Two times a day (BID) | SUBCUTANEOUS | Status: DC
  Filled 2018-10-22: qty 0.4

## 2018-10-22 MED ORDER — DEXTROSE 5 % IV SOLN
250.0000 mg | INTRAVENOUS | Status: DC
  Administered 2018-10-22: 10:00:00 250 mg via INTRAVENOUS
  Filled 2018-10-22: qty 250

## 2018-10-22 MED ORDER — SODIUM CHLORIDE 0.9 % IV SOLN: INTRAVENOUS | Status: DC

## 2018-10-22 MED ORDER — AZITHROMYCIN 500 MG PO TABS: 250.0000 mg | ORAL_TABLET | Freq: Every day | ORAL | Status: DC

## 2018-10-22 MED ORDER — PREDNISONE 50 MG PO TABS
50.0000 mg | ORAL_TABLET | Freq: Every day | ORAL | Status: DC
  Filled 2018-10-22: qty 1

## 2018-10-22 MED ORDER — ALBUTEROL SULFATE (2.5 MG/3ML) 0.083% IN NEBU: 2.5000 mg | INHALATION_SOLUTION | RESPIRATORY_TRACT | Status: DC | PRN

## 2018-10-22 MED ORDER — DEXTROSE 5 % IV SOLN: 250.0000 mg | INTRAVENOUS | Status: DC

## 2018-10-22 MED ORDER — IPRATROPIUM-ALBUTEROL 0.5-2.5 (3) MG/3ML IN SOLN
3.0000 mL | Freq: Once | RESPIRATORY_TRACT | Status: AC
  Administered 2018-10-22: 3 mL via RESPIRATORY_TRACT
  Filled 2018-10-22: qty 3

## 2018-10-22 MED ORDER — ALBUTEROL SULFATE (2.5 MG/3ML) 0.083% IN NEBU
5.0000 mg | INHALATION_SOLUTION | Freq: Once | RESPIRATORY_TRACT | Status: AC
  Administered 2018-10-22: 02:00:00 5 mg via RESPIRATORY_TRACT
  Filled 2018-10-22: qty 6

## 2018-10-22 MED ORDER — SODIUM CHLORIDE 0.9 % IV SOLN
1.0000 g | INTRAVENOUS | Status: DC
  Filled 2018-10-22 (×2): qty 10

## 2018-10-22 MED ORDER — ACETAMINOPHEN 325 MG PO TABS: 650.0000 mg | ORAL_TABLET | Freq: Four times a day (QID) | ORAL | Status: DC | PRN

## 2018-10-22 MED ORDER — ONDANSETRON HCL 4 MG/2ML IJ SOLN: 4.0000 mg | Freq: Four times a day (QID) | INTRAMUSCULAR | Status: DC | PRN

## 2018-10-22 MED ORDER — ACETAMINOPHEN 650 MG RE SUPP: 650.0000 mg | Freq: Four times a day (QID) | RECTAL | Status: DC | PRN

## 2018-10-22 MED ORDER — DOCUSATE SODIUM 100 MG PO CAPS
100.0000 mg | ORAL_CAPSULE | Freq: Two times a day (BID) | ORAL | Status: DC
  Filled 2018-10-22 (×2): qty 1

## 2018-10-22 MED ORDER — ONDANSETRON HCL 4 MG PO TABS: 4.0000 mg | ORAL_TABLET | Freq: Four times a day (QID) | ORAL | Status: DC | PRN

## 2018-10-22 MED ORDER — LEVOFLOXACIN 750 MG PO TABS: 750.0000 mg | ORAL_TABLET | Freq: Every day | ORAL | Status: DC

## 2018-10-22 MED ORDER — SODIUM CHLORIDE 0.9 % IV SOLN: 1.0000 g | Freq: Once | INTRAVENOUS | Status: DC

## 2018-10-22 NOTE — ED Notes (Signed)
Resting quietly with eyes closed.

## 2018-10-22 NOTE — Plan of Care (Signed)

## 2018-10-22 NOTE — ED Notes (Signed)
Continues to rest quietly with eyes closed

## 2018-10-22 NOTE — ED Provider Notes (Signed)
Procedures     ----------------------------------------- 4:07 AM on 10/22/2018 -----------------------------------------  Patient reassessed.  He is having good air entry bilaterally in his lungs, currently afebrile with normal heart rate and blood pressure.  However, oxygen saturations dropping into the mid 80s with talking.  X-ray demonstrates pneumonia, Covid negative, white blood cell count of 23,000.  With his acute hypoxic respiratory failure, comorbidities including asthma and morbid obesity, plan to hospitalize for further management.  Initial lactate was normal, does not need to be repeated.  Blood cultures have been sent.    Carrie Mew, MD 10/22/18 [phone]

## 2018-10-22 NOTE — H&P (Signed)
Ryan Beck is an 38 y.o. male.   Chief Complaint: Dizziness HPI: The patient with past medical history of asthma presents to the emergency department complaining of dizziness.  He states that he has been feeling unwell for the last 2 days.  He has had intermittent nonproductive coughing as well as general malaise.  He was hoping that he was going to get better by resting at home but began to feel dizzy every time he would move.  Upon presentation to the emergency department the patient was febrile to 101.7 F.  Oxygen saturations routinely dipped below 86%.  Chest x-ray showed patchy consolidation of the left upper lung.  He received multiple breathing treatments as well as Solu-Medrol.  He received a dose of oral doxycycline in anticipation of possible discharge but was found to meet criteria for sepsis.  Blood cultures were obtained and the patient was started on ceftriaxone prior to the emergency department staff calling the hospitalist service for admission.  Past Medical History:  Diagnosis Date  . Asthma     Past Surgical History:  Procedure Laterality Date  . NO PAST SURGERIES      Family History  Problem Relation Age of Onset  . Hypertension Other        The patient reports that almost all family members have high blood pressure   Social History:  reports that he has been smoking cigarettes. He has been smoking about 0.50 packs per day. He has never used smokeless tobacco. He reports that he does not drink alcohol or use drugs.  Allergies:  Allergies  Allergen Reactions  . Shellfish Allergy Swelling    Prior to Admission medications   Medication Sig Start Date End Date Taking? Authorizing Provider  albuterol (PROVENTIL HFA;VENTOLIN HFA) 108 (90 Base) MCG/ACT inhaler Inhale 2 puffs into the lungs every 6 (six) hours as needed for wheezing or shortness of breath. 08/20/15  Yes Gregor Hams, MD     Results for orders placed or performed during the hospital encounter of  10/21/18 (from the past 48 hour(s))  CBC with Differential     Status: Abnormal   Collection Time: 10/21/18 11:02 PM  Result Value Ref Range   WBC 23.5 (H) 4.0 - 10.5 K/uL   RBC 4.80 4.22 - 5.81 MIL/uL   Hemoglobin 12.7 (L) 13.0 - 17.0 g/dL   HCT 35.7 (L) 39.0 - 52.0 %   MCV 74.4 (L) 80.0 - 100.0 fL   MCH 26.5 26.0 - 34.0 pg   MCHC 35.6 30.0 - 36.0 g/dL   RDW 13.2 11.5 - 15.5 %   Platelets 281 150 - 400 K/uL   nRBC 0.0 0.0 - 0.2 %   Neutrophils Relative % 77 %   Neutro Abs 17.9 (H) 1.7 - 7.7 K/uL   Lymphocytes Relative 13 %   Lymphs Abs 3.1 0.7 - 4.0 K/uL   Monocytes Relative 9 %   Monocytes Absolute 2.2 (H) 0.1 - 1.0 K/uL   Eosinophils Relative 0 %   Eosinophils Absolute 0.0 0.0 - 0.5 K/uL   Basophils Relative 0 %   Basophils Absolute 0.1 0.0 - 0.1 K/uL   Immature Granulocytes 1 %   Abs Immature Granulocytes 0.18 (H) 0.00 - 0.07 K/uL    Comment: Performed at [facility], [address] [place], [place] [address]  Comprehensive metabolic panel     Status: Abnormal   Collection Time: 10/21/18 11:02 PM  Result Value Ref Range   Sodium 133 (L) 135 -  145 mmol/L   Potassium 3.8 3.5 - 5.1 mmol/L   Chloride 100 98 - 111 mmol/L   CO2 21 (L) 22 - 32 mmol/L   Glucose, Bld 161 (H) 70 - 99 mg/dL   BUN 14 6 - 20 mg/dL   Creatinine, Ser 8.291.21 0.61 - 1.24 mg/dL   Calcium 8.9 8.9 - 56.210.3 mg/dL   Total Protein 8.2 (H) 6.5 - 8.1 g/dL   Albumin 3.8 3.5 - 5.0 g/dL   AST 31 15 - 41 U/L   ALT 27 0 - 44 U/L   Alkaline Phosphatase 38 38 - 126 U/L   Total Bilirubin 0.9 0.3 - 1.2 mg/dL   GFR calc non Af Amer >60 >60 mL/min   GFR calc Af Amer >60 >60 mL/min   Anion gap 12 5 - 15    Comment: Performed at [facility], [address] [place], [place] [address]  Lactic acid, plasma     Status: None   Collection Time: 10/21/18 11:02 PM  Result Value Ref Range   Lactic Acid, Venous 1.4 0.5 - 1.9 mmol/L    Comment: Performed at [facility], [address]  [place], [place] [address]  Urinalysis, Routine w reflex microscopic     Status: Abnormal   Collection Time: 10/21/18 11:02 PM  Result Value Ref Range   Color, Urine AMBER (A) YELLOW    Comment: BIOCHEMICALS MAY BE AFFECTED BY COLOR   APPearance CLOUDY (A) CLEAR   Specific Gravity, Urine 1.027 1.005 - 1.030   pH 5.0 5.0 - 8.0   Glucose, UA NEGATIVE NEGATIVE mg/dL   Hgb urine dipstick MODERATE (A) NEGATIVE   Bilirubin Urine NEGATIVE NEGATIVE   Ketones, ur 20 (A) NEGATIVE mg/dL   Protein, ur 962100 (A) NEGATIVE mg/dL   Nitrite NEGATIVE NEGATIVE   Leukocytes,Ua NEGATIVE NEGATIVE   RBC / HPF 6-10 0 - 5 RBC/hpf   WBC, UA 21-50 0 - 5 WBC/hpf   Bacteria, UA NONE SEEN NONE SEEN   Squamous Epithelial / LPF 0-5 0 - 5   Mucus PRESENT    Hyaline Casts, UA PRESENT     Comment: Performed at [facility], [address] [place], [place] [address]  SARS Coronavirus 2 by RT PCR (hospital order, performed in [facility] hospital lab) Nasopharyngeal Nasopharyngeal Swab     Status: None   Collection Time: 10/21/18 11:02 PM   Specimen: Nasopharyngeal Swab  Result Value Ref Range   SARS Coronavirus 2 NEGATIVE NEGATIVE    Comment: (NOTE) If result is NEGATIVE SARS-CoV-2 target nucleic acids are NOT DETECTED. The SARS-CoV-2 RNA is generally detectable in upper and lower  respiratory specimens during the acute phase of infection. The lowest  concentration of SARS-CoV-2 viral copies this assay can detect is 250  copies / mL. A negative result does not preclude SARS-CoV-2 infection  and should not be used as the sole basis for treatment or other  patient management decisions.  A negative result may occur with  improper specimen collection / handling, submission of specimen other  than nasopharyngeal swab, presence of viral mutation(s) within the  areas targeted by this assay, and inadequate number of viral copies  (<250 copies / mL). A negative result must be combined with clinical  observations,  patient history, and epidemiological information. If result is POSITIVE SARS-CoV-2 target nucleic acids are DETECTED. The SARS-CoV-2 RNA is generally detectable in upper and lower  respiratory specimens dur ing the acute phase of infection.  Positive  results are indicative of active infection with SARS-CoV-2.  Clinical  correlation with patient history and other diagnostic information is  necessary to determine patient infection status.  Positive results do  not rule out bacterial infection or co-infection with other viruses. If result is PRESUMPTIVE POSTIVE SARS-CoV-2 nucleic acids MAY BE PRESENT.   A presumptive positive result was obtained on the submitted specimen  and confirmed on repeat testing.  While 2019 novel coronavirus  (SARS-CoV-2) nucleic acids may be present in the submitted sample  additional confirmatory testing may be necessary for epidemiological  and / or clinical management purposes  to differentiate between  SARS-CoV-2 and other Sarbecovirus currently known to infect humans.  If clinically indicated additional testing with an alternate test  methodology [phone]) is advised. The SARS-CoV-2 RNA is generally  detectable in upper and lower respiratory sp ecimens during the acute  phase of infection. The expected result is Negative. Fact Sheet for Patients:  BoilerBrush.com.cy Fact Sheet for Healthcare Providers: https://pope.com/ This test is not yet approved or cleared by the [place] FDA and has been authorized for detection and/or diagnosis of SARS-CoV-2 by FDA under an Emergency Use Authorization (EUA).  This EUA will remain in effect (meaning this test can be used) for the duration of the COVID-19 declaration under Section 564(b)(1) of the Act, 21 U.S.C. section 360bbb-3(b)(1), unless the authorization is terminated or revoked sooner. Performed at [facility], [address]  [place], [place] [address]   Troponin I (High Sensitivity)     Status: None   Collection Time: 10/21/18 11:02 PM  Result Value Ref Range   Troponin I (High Sensitivity) 10 <18 ng/L    Comment: (NOTE) Elevated high sensitivity troponin I (hsTnI) values and significant  changes across serial measurements may suggest ACS but many other  chronic and acute conditions are known to elevate hsTnI results.  Refer to the "Links" section for chest pain algorithms and additional  guidance. Performed at [facility], [address] [place], [place] [address]   Lactic acid, plasma     Status: None   Collection Time: 10/22/18  1:39 AM  Result Value Ref Range   Lactic Acid, Venous 1.5 0.5 - 1.9 mmol/L    Comment: Performed at [facility], [address] [place], [place] [address]  Troponin I (High Sensitivity)     Status: None   Collection Time: 10/22/18  1:39 AM  Result Value Ref Range   Troponin I (High Sensitivity) 10 <18 ng/L    Comment: (NOTE) Elevated high sensitivity troponin I (hsTnI) values and significant  changes across serial measurements may suggest ACS but many other  chronic and acute conditions are known to elevate hsTnI results.  Refer to the "Links" section for chest pain algorithms and additional  guidance. Performed at [facility], [address] [place], [place] [address]    Dg Chest Portable 1 View  Result Date: 10/21/2018 CLINICAL DATA:  Fever EXAM: PORTABLE CHEST 1 VIEW COMPARISON:  August 18, 2015 FINDINGS: The heart size and mediastinal contours are within normal limits. There is patchy airspace consolidation seen within the left upper lung. The right lung is clear. No acute osseous abnormality. IMPRESSION: Patchy area of consolidation within the left upper lung, likely consistent with pneumonia. Electronically Signed   By: Jonna Clark M.D.   On: 10/21/2018 22:54    Review of Systems  Constitutional: Negative for chills and fever.   HENT: Negative for sore throat and tinnitus.  Eyes: Negative for blurred vision and redness.  Respiratory: Positive for cough and shortness of breath.   Cardiovascular: Negative for chest pain, palpitations, orthopnea and PND.  Gastrointestinal: Negative for abdominal pain, diarrhea, nausea and vomiting.  Genitourinary: Negative for dysuria, frequency and urgency.  Musculoskeletal: Negative for joint pain and myalgias.  Skin: Negative for rash.       No lesions  Neurological: Negative for speech change, focal weakness and weakness.  Endo/Heme/Allergies: Does not bruise/bleed easily.       No temperature intolerance  Psychiatric/Behavioral: Negative for depression and suicidal ideas.    Blood pressure 136/80, pulse 78, temperature 98.5 F (36.9 C), temperature source Oral, resp. rate 19, height 5\' 7"  (1.702 m), weight (!) 144.2 kg, SpO2 94 %. Physical Exam  Vitals reviewed. Constitutional: He is oriented to person, place, and time. He appears well-developed and well-nourished. No distress.  HENT:  Head: Normocephalic and atraumatic.  Mouth/Throat: Oropharynx is clear and moist.  Eyes: Pupils are equal, round, and reactive to light. Conjunctivae and EOM are normal. No scleral icterus.  Neck: Normal range of motion. Neck supple. No JVD present. No tracheal deviation present. No thyromegaly present.  Cardiovascular: Normal rate, regular rhythm and normal heart sounds. Exam reveals no gallop and no friction rub.  No murmur heard. Respiratory: Effort normal and breath sounds normal. No respiratory distress.  GI: Soft. Bowel sounds are normal. He exhibits no distension. There is no abdominal tenderness.  Genitourinary:    Genitourinary Comments: Deferred   Musculoskeletal: Normal range of motion.        General: No edema.  Lymphadenopathy:    He has no cervical adenopathy.  Neurological: He is alert and oriented to person, place, and time. No cranial nerve deficit.  Skin: Skin is warm  and dry. No rash noted. No erythema.  Psychiatric: He has a normal mood and affect. His behavior is normal. Judgment and thought content normal.     Assessment/Plan This is a 38 year old male admitted for sepsis. 1.  Sepsis: The patient meets criteria via fever and leukocytosis.  He is hemodynamically stable.  Source is pneumonia.  Follow blood cultures for growth and sensitivities. 2.  Pneumonia: Community-acquired; continue ceftriaxone and azithromycin.  Albuterol as needed. 3.  Morbid obesity: BMI is 49.8; encourage healthy diet and exercise 4.  DVT prophylaxis: Lovenox 5.  GI prophylaxis: None The patient is a full code.  Time spent on admission orders and patient care approximately 45 minutes  41, MD 10/22/2018, 4:45 AM

## 2018-10-22 NOTE — ED Notes (Signed)
Pt ambulating in room, O 2 sats remain between 94-95% while ambulating.

## 2018-10-22 NOTE — Progress Notes (Signed)
[facility] NAME: Roland Lipke    MR#:  [id number]  DATE OF BIRTH:  [birth year]  SUBJECTIVE:  patient came in with feeling unwell for last two days. He is he had intermittent nonproductive cough. Currently feeling okay. Was found to have fever 101. Oxygen saturation stable. No fever today. Trying to eat breakfast earlier.  REVIEW OF SYSTEMS:   Review of Systems  Constitutional: Positive for malaise/fatigue. Negative for chills, fever and weight loss.  HENT: Negative for ear discharge, ear pain and nosebleeds.   Eyes: Negative for blurred vision, pain and discharge.  Respiratory: Positive for cough. Negative for sputum production, shortness of breath, wheezing and stridor.   Cardiovascular: Negative for chest pain, palpitations, orthopnea and PND.  Gastrointestinal: Negative for abdominal pain, diarrhea, nausea and vomiting.  Genitourinary: Negative for frequency and urgency.  Musculoskeletal: Negative for back pain and joint pain.  Neurological: Negative for sensory change, speech change, focal weakness and weakness.  Psychiatric/Behavioral: Negative for depression and hallucinations. The patient is not nervous/anxious.    Tolerating Diet: yes Tolerating PT: not needed  DRUG ALLERGIES:   Allergies  Allergen Reactions  . Shellfish Allergy Swelling    VITALS:  Blood pressure 138/81, pulse 77, temperature 98.8 F (37.1 C), temperature source Oral, resp. rate 15, height 5\' 7"  (1.702 m), weight (!) 144.2 kg, SpO2 95 %.  PHYSICAL EXAMINATION:   Physical Exam  GENERAL:  38 y.o.-year-old patient lying in the bed with no acute distress. Obese EYES: Pupils equal, round, reactive to light and accommodation. No scleral icterus. Extraocular muscles intact.  HEENT: Head atraumatic, normocephalic. Oropharynx and nasopharynx clear.  NECK:  Supple, no jugular venous distention. No thyroid enlargement, no tenderness.  LUNGS:  Normal breath sounds bilaterally, no wheezing, rales, rhonchi. No use of accessory muscles of respiration.  CARDIOVASCULAR: S1, S2 normal. No murmurs, rubs, or gallops.  ABDOMEN: Soft, nontender, nondistended. Bowel sounds present. No organomegaly or mass.  EXTREMITIES: No cyanosis, clubbing or edema b/l.    NEUROLOGIC: Cranial nerves II through XII are intact. No focal Motor or sensory deficits b/l.   PSYCHIATRIC:  patient is alert and oriented x 3.  SKIN: No obvious rash, lesion, or ulcer.   LABORATORY PANEL:  CBC Recent Labs  Lab 10/21/18 2302  WBC 23.5*  HGB 12.7*  HCT 35.7*  PLT 281    Chemistries  Recent Labs  Lab 10/21/18 2302  NA 133*  K 3.8  CL 100  CO2 21*  GLUCOSE 161*  BUN 14  CREATININE 1.21  CALCIUM 8.9  AST 31  ALT 27  ALKPHOS 38  BILITOT 0.9   Cardiac Enzymes No results for input(s): TROPONINI in the last 168 hours. RADIOLOGY:  Dg Chest Portable 1 View  Result Date: 10/21/2018 CLINICAL DATA:  Fever EXAM: PORTABLE CHEST 1 VIEW COMPARISON:  August 18, 2015 FINDINGS: The heart size and mediastinal contours are within normal limits. There is patchy airspace consolidation seen within the left upper lung. The right lung is clear. No acute osseous abnormality. IMPRESSION: Patchy area of consolidation within the left upper lung, likely consistent with pneumonia. Electronically Signed   By: Prudencio Pair M.D.   On: 10/21/2018 22:54   ASSESSMENT AND PLAN:  Korrey Schleicher is a 38 y.o. male with asthma who presents with feeling woozy.  Patient says he is been feeling fatigued.  His symptoms been on for 2 days now.  He says that he has been feeling fatigued  that is severe, constant, nothing makes better, nothing makes it worse  1.  SIRS The patient meets criteria via fever and leukocytosis.  He is hemodynamically stable.  Source is pneumonia.   -Follow blood cultures for growth and sensitivities.  2.  Pneumonia: Community-acquired; continue ceftriaxone and  azithromycin.   -Albuterol as needed.  3.  Morbid obesity: BMI is 49.8; encourage healthy diet and exercise   4.  DVT prophylaxis: Lovenox overall improving anticipate discharged tomorrow if continues to show improvement   case discussed with Care Management/Social Worker. Management plans discussed with the patient and they are in agreement.  CODE STATUS: full  DVT Prophylaxis: lovenox  TOTAL TIME TAKING CARE OF THIS PATIENT: *30* minutes.  >50% time spent on counselling and coordination of care  POSSIBLE D/C IN *1* DAYS, DEPENDING ON CLINICAL CONDITION.  Note: This dictation was prepared with Dragon dictation along with smaller phrase technology. Any transcriptional errors that result from this process are unintentional.  Enedina Finner M.D on 10/22/2018 at 1:04 PM  Between 7am to 6pm - Pager - [phone]  After 6pm go to [email or web address] - password [email or web address]  [facility]  Office  [phone]  CC: Primary care physician; Patient, No Pcp PerPatient ID: Orton Capell, male   DOB: 10/26/1980, 38 y.o.   MRN: [id number]

## 2018-10-22 NOTE — ED Notes (Signed)
Pt ambulated to bathroom, pt appears steady on feet. Pt reminded to call with call bell if he feels dizzy.

## 2018-10-22 NOTE — ED Notes (Signed)
Patient accidentally pulled out his IV.

## 2018-10-22 NOTE — Progress Notes (Signed)
PHARMACIST - PHYSICIAN COMMUNICATION  CONCERNING:  Enoxaparin (Lovenox) for DVT Prophylaxis    RECOMMENDATION: Patient was prescribed enoxaprin 40mg  q24 hours for VTE prophylaxis.   Filed Weights   10/21/18 2031  Weight: (!) 318 lb (144.2 kg)    Body mass index is 49.81 kg/m.  Estimated Creatinine Clearance: 111.7 mL/min (by C-G formula based on SCr of 1.21 mg/dL).  Based on [place] patient is candidate for enoxaparin 40mg  every 12 hour dosing due to BMI being >40.  DESCRIPTION: Pharmacy has adjusted enoxaparin dose per [facility] policy.  Patient is now receiving enoxaparin 40mg  every 12 hours.   Ena Dawley, PharmD Clinical Pharmacist  10/22/2018 6:37 AM

## 2018-10-22 NOTE — TOC Initial Note (Signed)
Transition of Care [facility]) - Initial/Assessment Note    Patient Details  Name: Aristide Waggle MRN: [id number] Date of Birth: 1980/04/21  Transition of Care [facility] CM/SW Contact:    Shiheem Corporan, Lenice Llamas Phone Number: [phone]  10/22/2018, 4:57 PM  Clinical Narrative: Clinical Social Worker (CSW) met with patient alone at bedside to discuss D/C plan and provide resources. Patient was alert and oriented X4 and was laying in the bed. CSW introduced self and explained role of CSW department. Per patient he works at [employer] and lives with his mother Doroteo Bradford and brother in [place]. Patient reported that he drives and has no issues with transportation. Patient reported that he does not have a PCP or health insurance. CSW provided patient with [facility] and Medication Management application. CSW also provided patient with low cost healthcare guide in [facility] booklet. Patient gave CSW permission to email his information to Lurena Nida at [employer]. CSW sent Lorrie an email making her aware of patient. Patient accepted resources and thanked CSW for visit. CSW will continue to follow and assist as needed.              Expected Discharge Plan: Home/Self Care Barriers to Discharge: Continued Medical Work up   Patient Goals and CMS Choice Patient states their goals for this hospitalization and ongoing recovery are:: To go home.      Expected Discharge Plan and Services Expected Discharge Plan: Home/Self Care In-house Referral: Clinical Social Work     Living arrangements for the past 2 months: Single Family Home                 DME Arranged: N/A         HH Arranged: NA          Prior Living Arrangements/Services Living arrangements for the past 2 months: Single Family Home Lives with:: Parents, Siblings Patient language and need for interpreter reviewed:: No Do you feel safe going back to the place where you live?: Yes      Need for Family Participation in Patient  Care: No (Comment) Care giver support system in place?: No (comment)   Criminal Activity/Legal Involvement Pertinent to Current Situation/Hospitalization: No - Comment as needed  Activities of Daily Living Home Assistive Devices/Equipment: None ADL Screening (condition at time of admission) Patient's cognitive ability adequate to safely complete daily activities?: Yes Is the patient deaf or have difficulty hearing?: No Does the patient have difficulty seeing, even when wearing glasses/contacts?: No Does the patient have difficulty concentrating, remembering, or making decisions?: No Patient able to express need for assistance with ADLs?: Yes Does the patient have difficulty dressing or bathing?: No Independently performs ADLs?: Yes (appropriate for developmental age) Does the patient have difficulty walking or climbing stairs?: No Weakness of Legs: Both Weakness of Arms/Hands: Both  Permission Sought/Granted Permission sought to share information with : Other (comment)([facility]) Permission granted to share information with : Yes, Verbal Permission Granted              Emotional Assessment Appearance:: Appears stated age Attitude/Demeanor/Rapport: Engaged Affect (typically observed): Pleasant Orientation: : Oriented to Self, Oriented to Place, Oriented to  Time, Oriented to Situation Alcohol / Substance Use: Not Applicable Psych Involvement: No (comment)  Admission diagnosis:  Acute respiratory failure with hypoxia ([place]) [J96.01] Community acquired pneumonia of left upper lobe of lung [J18.9] Patient Active Problem List   Diagnosis Date Noted  . Sepsis ([place]) 10/22/2018   PCP:  Patient, No Pcp Per Pharmacy:   [id number] [place] [id number]: [phone]: [phone]    Social Determinants of Health (SDOH) Interventions    Readmission Risk Interventions No flowsheet data found.

## 2018-10-23 LAB — CBC
HCT: 37 % — ABNORMAL LOW (ref 39.0–52.0)
Hemoglobin: 13.2 g/dL (ref 13.0–17.0)
MCH: 26.6 pg (ref 26.0–34.0)
MCHC: 35.7 g/dL (ref 30.0–36.0)
MCV: 74.6 fL — ABNORMAL LOW (ref 80.0–100.0)
Platelets: 316 10*3/uL (ref 150–400)
RBC: 4.96 MIL/uL (ref 4.22–5.81)
RDW: 13 % (ref 11.5–15.5)
WBC: 27.8 10*3/uL — ABNORMAL HIGH (ref 4.0–10.5)
nRBC: 0 % (ref 0.0–0.2)

## 2018-10-23 LAB — HEMOGLOBIN A1C
Hgb A1c MFr Bld: 6.9 % — ABNORMAL HIGH (ref 4.8–5.6)
Mean Plasma Glucose: 151 mg/dL

## 2018-10-23 MED ORDER — CEPHALEXIN 500 MG PO CAPS: 500.0000 mg | ORAL_CAPSULE | Freq: Two times a day (BID) | ORAL | 0 refills | Status: DC

## 2018-10-23 MED ORDER — CEPHALEXIN 500 MG PO CAPS
500.0000 mg | ORAL_CAPSULE | Freq: Two times a day (BID) | ORAL | Status: DC
  Administered 2018-10-23: 500 mg via ORAL
  Filled 2018-10-23: qty 1

## 2018-10-23 MED ORDER — AZITHROMYCIN 500 MG PO TABS
250.0000 mg | ORAL_TABLET | Freq: Every day | ORAL | Status: DC
  Administered 2018-10-23: 250 mg via ORAL
  Filled 2018-10-23: qty 1

## 2018-10-23 MED ORDER — ALBUTEROL SULFATE HFA 108 (90 BASE) MCG/ACT IN AERS: 2.0000 | INHALATION_SPRAY | Freq: Four times a day (QID) | RESPIRATORY_TRACT | 1 refills | Status: DC | PRN

## 2018-10-23 MED ORDER — AZITHROMYCIN 250 MG PO TABS: 250.0000 mg | ORAL_TABLET | Freq: Every day | ORAL | 0 refills | Status: DC

## 2018-10-23 NOTE — Discharge Instructions (Signed)
Follow the dietary recommendations given by dietitian for your diabetes

## 2018-10-23 NOTE — Plan of Care (Signed)
Nutrition Education Note   RD consulted for nutrition education regarding diabetes.   38 y/o male with asthma, morbid obesity, new DM admitted with SIRS secondary to CAP.   Lab Results  Component Value Date   HGBA1C 6.9 (H) 10/22/2018   Met with pt in room today. Pt reports good appetite and oral intake today and at baseline. Pt reports that this is a new diagnosis for him but he is familiar with DM as his whole family suffers from this. Pt is very unknowledgeable about diabetes; pt believes that DM is caused by eating pork. Pt reports that his grandma died about 20 minutes after she ate a ham sandwich and that he is really cautious about his salt intake. RD explained that although it is good to limit excess salt from the diet, that this does not affect diabetes. Provided basic diet education for patient focusing mainly on eliminating "empty calories and foods that are not nutrient dense" and by avoiding "white" starchy foods and instead having the "brown foods" or whole grains. Pt reports drinking a lot of sugary energy drinks (4-5 per day). RD helped to provide patient with alternative drinks and ways to increase his energy. Pt also reports skipping meals often; RD advised patient to drink a protein supplement, have some sort of high fiber bar or grab a piece of fruit instead of skipping meals.   RD provided "Nutrition and Type II Diabetes" handout from the Academy of Nutrition and Dietetics. Discussed different food groups and their effects on blood sugar, emphasizing carbohydrate-containing foods. Provided list of carbohydrates and recommended serving sizes of common foods.  Discussed importance of controlled and consistent carbohydrate intake throughout the day. Provided examples of ways to balance meals/snacks and encouraged intake of high-fiber, whole grain complex carbohydrates. Teach back method used.  Expect poor compliance.  Body mass index is 49.81 kg/m. Pt meets criteria for morbid  obesity based on current BMI.  Current diet order is CHO modified, patient is consuming approximately 100% of meals at this time. Labs and medications reviewed. No further nutrition interventions warranted at this time. RD contact information provided. If additional nutrition issues arise, please re-consult RD.  Koleen Distance MS, RD, LDN Pager #- [phone] Office#- [phone] After Hours Pager: [phone]

## 2018-10-23 NOTE — Discharge Summary (Signed)
[facility] [place] at [facility]   PATIENT NAME: Ryan Beck    MR#:  [id number]  DATE OF BIRTH:  24-Feb-1980  DATE OF ADMISSION:  10/21/2018 ADMITTING PHYSICIAN: Arnaldo Natal, MD  DATE OF DISCHARGE: 10/23/2018  PRIMARY CARE PHYSICIAN: Patient, No Pcp Per    ADMISSION DIAGNOSIS:  Acute respiratory failure with hypoxia (HCC) [J96.01] Community acquired pneumonia of left upper lobe of lung [J18.9]  DISCHARGE DIAGNOSIS:  community acquired pneumonia new onset diabetes type II obesity  SECONDARY DIAGNOSIS:   Past Medical History:  Diagnosis Date  . Asthma     HOSPITAL COURSE:   Cliffton Bennettis a 38 y.o.malewith asthma who presents with feeling woozy.Patient says he is been feeling fatigued. His symptoms been on for 2 days now.He says that he has been feeling fatigued that is severe, constant, nothing makes better, nothing makes it worse  1. SIRS The patient meets criteria via fever and leukocytosis. He is hemodynamically stable. Source is pneumonia.  -Follow blood cultures is negative -patient is afebrile. -White count is elevated which is likely due to the high dose of steroid he received in the ER -patient currently not wheezing will discontinue steroids. -Switch to oral Keflex and Zithromax to complete treatment as outpatient  2. Pneumonia: Community-acquired; continue ceftriaxone and azithromycin.  -Albuterol as needed.  3. Morbid obesity: BMI is 49.8; encourage healthy diet and exercise   4. DVT prophylaxis: Lovenox overall improving. Discharge home today  5. Type II diabetes-- new onset-A1c 6.9 -dietitian consultation placed for dietary counseling -patient reports exercising few times a week -he wants to try diet and exercise at present. I will hold off on any oral treatment. Patient is asked to get primary care physician in the area and keep the tab on his A1c and sugar check.  He has been given  booklet to get primary care physician in the area since he has no insurance.  Patient voiced understanding. CONSULTS OBTAINED:    DRUG ALLERGIES:   Allergies  Allergen Reactions  . Shellfish Allergy Swelling    DISCHARGE MEDICATIONS:   Allergies as of 10/23/2018      Reactions   Shellfish Allergy Swelling      Medication List    TAKE these medications   albuterol 108 (90 Base) MCG/ACT inhaler Commonly known as: VENTOLIN HFA Inhale 2 puffs into the lungs every 6 (six) hours as needed for wheezing or shortness of breath.   azithromycin 250 MG tablet Commonly known as: ZITHROMAX Take 1 tablet (250 mg total) by mouth daily. Start taking on: October 24, 2018   cephALEXin 500 MG capsule Commonly known as: KEFLEX Take 1 capsule (500 mg total) by mouth every 12 (twelve) hours.       If you experience worsening of your admission symptoms, develop shortness of breath, life threatening emergency, suicidal or homicidal thoughts you must seek medical attention immediately by calling 911 or calling your MD immediately  if symptoms less severe.  You Must read complete instructions/literature along with all the possible adverse reactions/side effects for all the Medicines you take and that have been prescribed to you. Take any new Medicines after you have completely understood and accept all the possible adverse reactions/side effects.   Please note  You were cared for by a hospitalist during your hospital stay. If you have any questions about your discharge medications or the care you received while you were in the hospital after you are discharged, you can call the unit and  asked to speak with the hospitalist on call if the hospitalist that took care of you is not available. Once you are discharged, your primary care physician will handle any further medical issues. Please note that NO REFILLS for any discharge medications will be authorized once you are discharged, as it is imperative  that you return to your primary care physician (or establish a relationship with a primary care physician if you do not have one) for your aftercare needs so that they can reassess your need for medications and monitor your lab values. Today   SUBJECTIVE   Complains of cough. No fever  VITAL SIGNS:  Blood pressure 128/83, pulse 82, temperature 98.7 F (37.1 C), temperature source Oral, resp. rate 16, height 5\' 7"  (1.702 m), weight (!) 144.2 kg, SpO2 100 %.  I/O:    Intake/Output Summary (Last 24 hours) at 10/23/2018 1048 Last data filed at 10/23/2018 0900 Gross per 24 hour  Intake 600 ml  Output -  Net 600 ml    PHYSICAL EXAMINATION:  GENERAL:  38 y.o.-year-old patient lying in the bed with no acute distress.  EYES: Pupils equal, round, reactive to light and accommodation. No scleral icterus. Extraocular muscles intact.  HEENT: Head atraumatic, normocephalic. Oropharynx and nasopharynx clear.  NECK:  Supple, no jugular venous distention. No thyroid enlargement, no tenderness.  LUNGS: Normal breath sounds bilaterally, no wheezing, rales,rhonchi or crepitation. No use of accessory muscles of respiration.  CARDIOVASCULAR: S1, S2 normal. No murmurs, rubs, or gallops.  ABDOMEN: Soft, non-tender, non-distended. Bowel sounds present. No organomegaly or mass.  EXTREMITIES: No pedal edema, cyanosis, or clubbing.  NEUROLOGIC: Cranial nerves II through XII are intact. Muscle strength 5/5 in all extremities. Sensation intact. Gait not checked.  PSYCHIATRIC: The patient is alert and oriented x 3.  SKIN: No obvious rash, lesion, or ulcer.   DATA REVIEW:   CBC  Recent Labs  Lab 10/23/18 0324  WBC 27.8*  HGB 13.2  HCT 37.0*  PLT 316    Chemistries  Recent Labs  Lab 10/21/18 2302  NA 133*  K 3.8  CL 100  CO2 21*  GLUCOSE 161*  BUN 14  CREATININE 1.21  CALCIUM 8.9  AST 31  ALT 27  ALKPHOS 38  BILITOT 0.9    Microbiology Results   Recent Results (from the past 240  hour(s))  Blood culture (routine x 2)     Status: None (Preliminary result)   Collection Time: 10/21/18 11:02 PM   Specimen: BLOOD  Result Value Ref Range Status   Specimen Description BLOOD LEFT HAND  Final   Special Requests   Final    BOTTLES DRAWN AEROBIC AND ANAEROBIC Blood Culture adequate volume   Culture   Final    NO GROWTH 2 DAYS Performed at [facility], [address], [place], [place]    Report Status PENDING  Incomplete  SARS Coronavirus 2 by RT PCR (hospital order, performed in [facility] hospital lab) Nasopharyngeal Nasopharyngeal Swab     Status: None   Collection Time: 10/21/18 11:02 PM   Specimen: Nasopharyngeal Swab  Result Value Ref Range Status   SARS Coronavirus 2 NEGATIVE NEGATIVE Final    Comment: (NOTE) If result is NEGATIVE SARS-CoV-2 target nucleic acids are NOT DETECTED. The SARS-CoV-2 RNA is generally detectable in upper and lower  respiratory specimens during the acute phase of infection. The lowest  concentration of SARS-CoV-2 viral copies this assay can detect is 250  copies / mL. A negative  result does not preclude SARS-CoV-2 infection  and should not be used as the sole basis for treatment or other  patient management decisions.  A negative result may occur with  improper specimen collection / handling, submission of specimen other  than nasopharyngeal swab, presence of viral mutation(s) within the  areas targeted by this assay, and inadequate number of viral copies  (<250 copies / mL). A negative result must be combined with clinical  observations, patient history, and epidemiological information. If result is POSITIVE SARS-CoV-2 target nucleic acids are DETECTED. The SARS-CoV-2 RNA is generally detectable in upper and lower  respiratory specimens dur ing the acute phase of infection.  Positive  results are indicative of active infection with SARS-CoV-2.  Clinical  correlation with patient history and other diagnostic  information is  necessary to determine patient infection status.  Positive results do  not rule out bacterial infection or co-infection with other viruses. If result is PRESUMPTIVE POSTIVE SARS-CoV-2 nucleic acids MAY BE PRESENT.   A presumptive positive result was obtained on the submitted specimen  and confirmed on repeat testing.  While 2019 novel coronavirus  (SARS-CoV-2) nucleic acids may be present in the submitted sample  additional confirmatory testing may be necessary for epidemiological  and / or clinical management purposes  to differentiate between  SARS-CoV-2 and other Sarbecovirus currently known to infect humans.  If clinically indicated additional testing with an alternate test  methodology [phone]) is advised. The SARS-CoV-2 RNA is generally  detectable in upper and lower respiratory sp ecimens during the acute  phase of infection. The expected result is Negative. Fact Sheet for Patients:  StrictlyIdeas.no Fact Sheet for Healthcare Providers: BankingDealers.co.za This test is not yet approved or cleared by the [place] FDA and has been authorized for detection and/or diagnosis of SARS-CoV-2 by FDA under an Emergency Use Authorization (EUA).  This EUA will remain in effect (meaning this test can be used) for the duration of the COVID-19 declaration under Section 564(b)(1) of the Act, 21 U.S.C. section 360bbb-3(b)(1), unless the authorization is terminated or revoked sooner. Performed at [facility], [address] [place], [place] [address]   Blood culture (routine x 2)     Status: None (Preliminary result)   Collection Time: 10/21/18 11:03 PM   Specimen: BLOOD  Result Value Ref Range Status   Specimen Description BLOOD RIGHT HAND  Final   Special Requests   Final    BOTTLES DRAWN AEROBIC AND ANAEROBIC Blood Culture results may not be optimal due to an excessive volume of blood received in culture  bottles   Culture  Setup Time PENDING  Incomplete   Culture   Final    NO GROWTH 2 DAYS Performed at [facility], [address] [place],  [address]    Report Status PENDING  Incomplete    RADIOLOGY:  Dg Chest Portable 1 View  Result Date: 10/21/2018 CLINICAL DATA:  Fever EXAM: PORTABLE CHEST 1 VIEW COMPARISON:  August 18, 2015 FINDINGS: The heart size and mediastinal contours are within normal limits. There is patchy airspace consolidation seen within the left upper lung. The right lung is clear. No acute osseous abnormality. IMPRESSION: Patchy area of consolidation within the left upper lung, likely consistent with pneumonia. Electronically Signed   By: Prudencio Pair M.D.   On: 10/21/2018 22:54     CODE STATUS:     Code Status Orders  (From admission, onward)         Start  Ordered   10/22/18 0634  Full code  Continuous     10/22/18 0634        Code Status History    This patient has a current code status but no historical code status.   Advance Care Planning Activity      TOTAL TIME TAKING CARE OF THIS PATIENT: 40 minutes.    Enedina FinnerSona Tomi Grandpre M.D on 10/23/2018 at 10:48 AM  Between 7am to 6pm - Pager - [phone] After 6pm go to [email or web address] - password [email or web address]  [facility]  Office  [phone]  CC: Primary care physician; Patient, No Pcp Per

## 2018-10-23 NOTE — TOC Transition Note (Signed)
Transition of Care [facility]) - CM/SW Discharge Note   Patient Details  Name: Taitum Alms MRN: [id number] Date of Birth: 11-19-1980  Transition of Care [facility]) CM/SW Contact:  Candie Chroman, LCSW Phone Number: 10/23/2018, 11:42 AM   Clinical Narrative: CSW called [facility] where [employer] and Keflex were sent. Since patient does not have insurance they will provide savings coupon and both medications will cost around $20 total. Patient left before CSW could see him so called and gave him the information. No further concerns. CSW signing off.    Final next level of care: Home/Self Care Barriers to Discharge: Barriers Resolved   Patient Goals and CMS Choice Patient states their goals for this hospitalization and ongoing recovery are:: To go home.      Discharge Placement                    Patient and family notified of of transfer: 10/23/18  Discharge Plan and Services In-house Referral: Clinical Social Work              DME Arranged: N/A         HH Arranged: NA          Social Determinants of Health (SDOH) Interventions     Readmission Risk Interventions No flowsheet data found.

## 2018-10-23 NOTE — Progress Notes (Addendum)
Pt became irate and said he was ready to go as soon as dietician left his room. I spoke with pt about waiting for discharge orders and prescriptions. Pt stated "My ride has been downstairs since 730 this morning. I was told my discharge stuff was in and that I would be able to go as soon as the dietician saw me! Now she just came and I am ready to go!" I told pt to give me a moment to contact the doctor and see what we could do to speed things up. He said "I don't know who is going to sign those papers because I'm not going to be here." I contacted the unit secretary to bring me an AMA paper and continued to contact MD Posey Pronto via [employer]. Dr. Posey Pronto said to ask him to wait as she was placing his d/c orders at that time. I repeated message to pt. Pt refused. AMA paperwork given and signed. Pt walked off unit. IV was removed by pt on 10/19. MD made aware.

## 2018-10-26 LAB — CULTURE, BLOOD (ROUTINE X 2)
Culture: NO GROWTH
Special Requests: ADEQUATE

## 2018-10-27 LAB — CULTURE, BLOOD (ROUTINE X 2): Culture: NO GROWTH

## 2019-01-25 ENCOUNTER — Emergency Department
Admission: EM | Admit: 2019-01-25 | Discharge: 2019-01-25 | Disposition: A | Discharge status: Home / Self Care | Payer: Self-pay | Attending: Emergency Medicine | Admitting: Emergency Medicine

## 2019-01-25 ENCOUNTER — Other Ambulatory Visit: Payer: Self-pay

## 2019-01-25 DIAGNOSIS — Y999 Unspecified external cause status: Secondary | ICD-10-CM | POA: Insufficient documentation

## 2019-01-25 DIAGNOSIS — J45909 Unspecified asthma, uncomplicated: Secondary | ICD-10-CM | POA: Insufficient documentation

## 2019-01-25 DIAGNOSIS — X509XXA Other and unspecified overexertion or strenuous movements or postures, initial encounter: Secondary | ICD-10-CM | POA: Insufficient documentation

## 2019-01-25 DIAGNOSIS — S76212A Strain of adductor muscle, fascia and tendon of left thigh, initial encounter: Secondary | ICD-10-CM | POA: Insufficient documentation

## 2019-01-25 DIAGNOSIS — Y9389 Activity, other specified: Secondary | ICD-10-CM | POA: Insufficient documentation

## 2019-01-25 DIAGNOSIS — F17210 Nicotine dependence, cigarettes, uncomplicated: Secondary | ICD-10-CM | POA: Insufficient documentation

## 2019-01-25 DIAGNOSIS — Y929 Unspecified place or not applicable: Secondary | ICD-10-CM | POA: Insufficient documentation

## 2019-01-25 MED ORDER — ORPHENADRINE CITRATE 30 MG/ML IJ SOLN
60.0000 mg | Freq: Two times a day (BID) | INTRAMUSCULAR | Status: DC
  Administered 2019-01-25: 09:00:00 60 mg via INTRAMUSCULAR
  Filled 2019-01-25: qty 2

## 2019-01-25 MED ORDER — KETOROLAC TROMETHAMINE 60 MG/2ML IM SOLN
60.0000 mg | Freq: Once | INTRAMUSCULAR | Status: AC
  Administered 2019-01-25: 09:00:00 60 mg via INTRAMUSCULAR
  Filled 2019-01-25: qty 2

## 2019-01-25 MED ORDER — OXYCODONE-ACETAMINOPHEN 7.5-325 MG PO TABS: 1.0000 | ORAL_TABLET | Freq: Four times a day (QID) | ORAL | 0 refills | Status: DC | PRN

## 2019-01-25 MED ORDER — CYCLOBENZAPRINE HCL 10 MG PO TABS: 10.0000 mg | ORAL_TABLET | Freq: Three times a day (TID) | ORAL | 0 refills | Status: DC | PRN

## 2019-01-25 MED ORDER — HYDROMORPHONE HCL 1 MG/ML IJ SOLN
1.0000 mg | Freq: Once | INTRAMUSCULAR | Status: AC
  Administered 2019-01-25: 09:00:00 1 mg via INTRAMUSCULAR
  Filled 2019-01-25: qty 1

## 2019-01-25 MED ORDER — KETOROLAC TROMETHAMINE 10 MG PO TABS: 10.0000 mg | ORAL_TABLET | Freq: Four times a day (QID) | ORAL | 0 refills | Status: DC | PRN

## 2019-01-25 NOTE — ED Triage Notes (Signed)
Pt arrives via pov, ambulatory to registration desk slow and limping, pt reports left leg pain pointing below the hip down the outside of his thigh, denies any known injury, or ever having pain like this before. Pt offered w/c but states more painful in the chair

## 2019-01-25 NOTE — ED Notes (Signed)
See triage note  Presents with left leg pain  States pain started around 7 pm last night  Pain is from hip area and radiates to just above the knee  Denies any recent injury

## 2019-01-25 NOTE — ED Provider Notes (Signed)
[facility] Emergency Department Provider Note   ____________________________________________   First MD Initiated Contact with Patient 01/25/19 [phone]     (approximate)  I have reviewed the triage vital signs and the nursing notes.   HISTORY  Chief Complaint Leg Pain    HPI Ryan Beck is a 40 y.o. male patient complain of left inguinal pain.  Patient the pain starts from the hip radiates to his thigh.  Patient onset of pain after sitting at a table and when he finished eating and stood up.  Patient denies any other provocative incident for complaint.  Patient rates the pain as a 10/10.  Patient states pain increases with abduction of the hip and bending of the knees.    Past Medical History:  Diagnosis Date  . Asthma     Patient Active Problem List   Diagnosis Date Noted  . Sepsis (HCC) 10/22/2018    Past Surgical History:  Procedure Laterality Date  . NO PAST SURGERIES      Prior to Admission medications   Medication Sig Start Date End Date Taking? Authorizing Provider  cyclobenzaprine (FLEXERIL) 10 MG tablet Take 1 tablet (10 mg total) by mouth 3 (three) times daily as needed. 01/25/19   Joni Reining, PA-C  ketorolac (TORADOL) 10 MG tablet Take 1 tablet (10 mg total) by mouth every 6 (six) hours as needed. 01/25/19   Joni Reining, PA-C  oxyCODONE-acetaminophen (PERCOCET) 7.5-325 MG tablet Take 1 tablet by mouth every 6 (six) hours as needed. 01/25/19   Joni Reining, PA-C    Allergies Shellfish allergy  Family History  Problem Relation Age of Onset  . Hypertension Other        The patient reports that almost all family members have high blood pressure    Social History Social History   Tobacco Use  . Smoking status: Current Every Day Smoker    Packs/day: 0.50    Types: Cigarettes  . Smokeless tobacco: Never Used  Substance Use Topics  . Alcohol use: No  . Drug use: No    Review of Systems Constitutional: No  fever/chills Eyes: No visual changes. ENT: No sore throat. Cardiovascular: Denies chest pain. Respiratory: Denies shortness of breath. Gastrointestinal: No abdominal pain.  No nausea, no vomiting.  No diarrhea.  No constipation. Genitourinary: Negative for dysuria. Musculoskeletal: Left ankle arm and leg pain. Skin: Negative for rash. Neurological: Negative for headaches, focal weakness or numbness. Allergic/Immunilogical: Shellfish ____________________________________________   PHYSICAL EXAM:  VITAL SIGNS: ED Triage Vitals [01/25/19 0807]  Enc Vitals Group     BP (!) 148/88     Pulse Rate 100     Resp 18     Temp 98 F (36.7 C)     Temp Source Oral     SpO2 97 %     Weight (!) 315 lb (142.9 kg)     Height 5\' 7"  (1.702 m)     Head Circumference      Peak Flow      Pain Score 10     Pain Loc      Pain Edu?      Excl. in GC?     Constitutional: Alert and oriented. Well appearing and in no acute distress.  Morbid obesity. Cardiovascular: Normal rate, regular rhythm. Grossly normal heart sounds.  Good peripheral circulation. Respiratory: Normal respiratory effort.  No retractions. Lungs CTAB. Gastrointestinal: Soft and nontender. No distention. No abdominal bruits. No CVA tenderness. Musculoskeletal: Patient has moderate  guarding palpation left inguinal area.  No masses palpated.  Patient has decreased range of motion with flexion of the bilateral knees in the supine position.  Neurologic:  Normal speech and language. No gross focal neurologic deficits are appreciated. No gait instability. Skin:  Skin is warm, dry and intact. No rash noted.  No abrasion or ecchymosis. Psychiatric: Mood and affect are normal. Speech and behavior are normal.  ____________________________________________   LABS (all labs ordered are listed, but only abnormal results are displayed)  Labs Reviewed - No data to  display ____________________________________________  EKG   ____________________________________________  RADIOLOGY  ED MD interpretation:    Official radiology report(s): No results found.  ____________________________________________   PROCEDURES  Procedure(s) performed (including Critical Care):  Procedures   ____________________________________________   INITIAL IMPRESSION / ASSESSMENT AND PLAN / ED COURSE  As part of my medical decision making, I reviewed the following data within the [place]     Patient presents with left inguinal pain secondary to strain.  Patient given discharge care instructions and work note.  Patient advised take medication as directed.  Patient advised establish care with [facility].    Ezra Sites was evaluated in Emergency Department on 01/25/2019 for the symptoms described in the history of present illness. He was evaluated in the context of the global COVID-19 pandemic, which necessitated consideration that the patient might be at risk for infection with the SARS-CoV-2 virus that causes COVID-19. Institutional protocols and algorithms that pertain to the evaluation of patients at risk for COVID-19 are in a state of rapid change based on information released by regulatory bodies including the CDC and federal and state organizations. These policies and algorithms were followed during the patient's care in the ED.       ____________________________________________   FINAL CLINICAL IMPRESSION(S) / ED DIAGNOSES  Final diagnoses:  Inguinal strain, left, initial encounter     ED Discharge Orders         Ordered    oxyCODONE-acetaminophen (PERCOCET) 7.5-325 MG tablet  Every 6 hours PRN     01/25/19 0925    cyclobenzaprine (FLEXERIL) 10 MG tablet  3 times daily PRN     01/25/19 0925    ketorolac (TORADOL) 10 MG tablet  Every 6 hours PRN     01/25/19 6213           Note:  This document was prepared  using Dragon voice recognition software and may include unintentional dictation errors.    Sable Feil, PA-C 01/25/19 0865    Carrie Mew, MD 01/25/19 [phone]

## 2019-01-25 NOTE — Discharge Instructions (Signed)
Follow discharge care instruction take medication as directed.  Be advised medication may cause drowsiness. 

## 2019-03-22 ENCOUNTER — Emergency Department: Payer: Self-pay

## 2019-03-22 ENCOUNTER — Inpatient Hospital Stay
Admission: EM | Admit: 2019-03-22 | Discharge: 2019-03-23 | DRG: 202 | Discharge status: Against Medical Advice | Payer: Self-pay | Attending: Internal Medicine | Admitting: Internal Medicine

## 2019-03-22 ENCOUNTER — Other Ambulatory Visit: Payer: Self-pay

## 2019-03-22 ENCOUNTER — Encounter: Payer: Self-pay | Admitting: Emergency Medicine

## 2019-03-22 DIAGNOSIS — R030 Elevated blood-pressure reading, without diagnosis of hypertension: Secondary | ICD-10-CM

## 2019-03-22 DIAGNOSIS — D72829 Elevated white blood cell count, unspecified: Secondary | ICD-10-CM

## 2019-03-22 DIAGNOSIS — F17210 Nicotine dependence, cigarettes, uncomplicated: Secondary | ICD-10-CM | POA: Diagnosis present

## 2019-03-22 DIAGNOSIS — J45901 Unspecified asthma with (acute) exacerbation: Principal | ICD-10-CM

## 2019-03-22 DIAGNOSIS — R0902 Hypoxemia: Secondary | ICD-10-CM

## 2019-03-22 DIAGNOSIS — Z20822 Contact with and (suspected) exposure to covid-19: Secondary | ICD-10-CM | POA: Diagnosis present

## 2019-03-22 DIAGNOSIS — Z91013 Allergy to seafood: Secondary | ICD-10-CM

## 2019-03-22 DIAGNOSIS — R7309 Other abnormal glucose: Secondary | ICD-10-CM

## 2019-03-22 DIAGNOSIS — G4733 Obstructive sleep apnea (adult) (pediatric): Secondary | ICD-10-CM | POA: Diagnosis present

## 2019-03-22 DIAGNOSIS — J40 Bronchitis, not specified as acute or chronic: Secondary | ICD-10-CM

## 2019-03-22 DIAGNOSIS — J9601 Acute respiratory failure with hypoxia: Secondary | ICD-10-CM | POA: Diagnosis present

## 2019-03-22 LAB — CBC WITH DIFFERENTIAL/PLATELET
Abs Immature Granulocytes: 0.1 10*3/uL — ABNORMAL HIGH (ref 0.00–0.07)
Basophils Absolute: 0.1 10*3/uL (ref 0.0–0.1)
Basophils Relative: 0 %
Eosinophils Absolute: 0.5 10*3/uL (ref 0.0–0.5)
Eosinophils Relative: 2 %
HCT: 42.2 % (ref 39.0–52.0)
Hemoglobin: 14.9 g/dL (ref 13.0–17.0)
Immature Granulocytes: 1 %
Lymphocytes Relative: 15 %
Lymphs Abs: 3.1 10*3/uL (ref 0.7–4.0)
MCH: 26.5 pg (ref 26.0–34.0)
MCHC: 35.3 g/dL (ref 30.0–36.0)
MCV: 75 fL — ABNORMAL LOW (ref 80.0–100.0)
Monocytes Absolute: 1.3 10*3/uL — ABNORMAL HIGH (ref 0.1–1.0)
Monocytes Relative: 6 %
Neutro Abs: 15.3 10*3/uL — ABNORMAL HIGH (ref 1.7–7.7)
Neutrophils Relative %: 76 %
Platelets: 319 10*3/uL (ref 150–400)
RBC: 5.63 MIL/uL (ref 4.22–5.81)
RDW: 13.7 % (ref 11.5–15.5)
WBC: 20.3 10*3/uL — ABNORMAL HIGH (ref 4.0–10.5)
nRBC: 0 % (ref 0.0–0.2)

## 2019-03-22 LAB — COMPREHENSIVE METABOLIC PANEL
ALT: 38 U/L (ref 0–44)
AST: 39 U/L (ref 15–41)
Albumin: 4.8 g/dL (ref 3.5–5.0)
Alkaline Phosphatase: 53 U/L (ref 38–126)
Anion gap: 12 (ref 5–15)
BUN: 18 mg/dL (ref 6–20)
CO2: 26 mmol/L (ref 22–32)
Calcium: 9.5 mg/dL (ref 8.9–10.3)
Chloride: 101 mmol/L (ref 98–111)
Creatinine, Ser: 1.04 mg/dL (ref 0.61–1.24)
GFR calc Af Amer: >60 mL/min (ref >60)
GFR calc non Af Amer: >60 mL/min (ref >60)
Glucose, Bld: 147 mg/dL — ABNORMAL HIGH (ref 70–99)
Potassium: 4.7 mmol/L (ref 3.5–5.1)
Sodium: 139 mmol/L (ref 135–145)
Total Bilirubin: 0.9 mg/dL (ref 0.3–1.2)
Total Protein: 8.7 g/dL — ABNORMAL HIGH (ref 6.5–8.1)

## 2019-03-22 LAB — POC SARS CORONAVIRUS 2 AG: SARS Coronavirus 2 Ag: NEGATIVE

## 2019-03-22 MED ORDER — METHYLPREDNISOLONE SODIUM SUCC 125 MG IJ SOLR
125.0000 mg | Freq: Once | INTRAMUSCULAR | Status: AC
  Administered 2019-03-22: 125 mg via INTRAVENOUS
  Filled 2019-03-22: qty 2

## 2019-03-22 MED ORDER — SODIUM CHLORIDE 0.9 % IV SOLN
2.0000 g | INTRAVENOUS | Status: DC
  Administered 2019-03-22: 2 g via INTRAVENOUS
  Filled 2019-03-22: qty 20

## 2019-03-22 MED ORDER — IPRATROPIUM-ALBUTEROL 0.5-2.5 (3) MG/3ML IN SOLN
3.0000 mL | Freq: Once | RESPIRATORY_TRACT | Status: AC
  Administered 2019-03-22: 3 mL via RESPIRATORY_TRACT
  Filled 2019-03-22: qty 3

## 2019-03-22 MED ORDER — MAGNESIUM SULFATE 2 GM/50ML IV SOLN
2.0000 g | Freq: Once | INTRAVENOUS | Status: AC
  Administered 2019-03-22: 2 g via INTRAVENOUS
  Filled 2019-03-22: qty 50

## 2019-03-22 MED ORDER — SODIUM CHLORIDE 0.9 % IV SOLN
500.0000 mg | INTRAVENOUS | Status: DC
  Administered 2019-03-22: 500 mg via INTRAVENOUS
  Filled 2019-03-22: qty 500

## 2019-03-22 MED ORDER — ALBUTEROL SULFATE (2.5 MG/3ML) 0.083% IN NEBU
2.5000 mg | INHALATION_SOLUTION | Freq: Once | RESPIRATORY_TRACT | Status: AC
  Administered 2019-03-22: 2.5 mg via RESPIRATORY_TRACT
  Filled 2019-03-22: qty 3

## 2019-03-22 NOTE — ED Notes (Signed)
See triage note, pt c.o SHOB that started today. Hx asthma. Pt dyspneic with rest and exertion

## 2019-03-22 NOTE — ED Provider Notes (Addendum)
[facility] Emergency Department Provider Note    First MD Initiated Contact with Patient 03/22/19 2109     (approximate)  I have reviewed the triage vital signs and the nursing notes.   HISTORY  Chief Complaint Shortness of Breath    HPI Ryan Beck is a 39 y.o. male with a history of asthma presents the ER with several hours of progressively worsening shortness of breath cough and congestion.  Has not had any measured fevers or temperatures.  No recent antibiotics.  States he ran out of his albuterol rescue inhaler.  Denies any pain.  No lower extremity swelling.    Denies any specific chest pain.  No pleuritic discomfort.  States he has required hospitalization as a child for asthma.   Past Medical History:  Diagnosis Date  . Asthma    Family History  Problem Relation Age of Onset  . Hypertension Other        The patient reports that almost all family members have high blood pressure   Past Surgical History:  Procedure Laterality Date  . NO PAST SURGERIES     Patient Active Problem List   Diagnosis Date Noted  . Sepsis (HCC) 10/22/2018      Prior to Admission medications   Medication Sig Start Date End Date Taking? Authorizing Provider  cyclobenzaprine (FLEXERIL) 10 MG tablet Take 1 tablet (10 mg total) by mouth 3 (three) times daily as needed. 01/25/19   Joni Reining, PA-C  ketorolac (TORADOL) 10 MG tablet Take 1 tablet (10 mg total) by mouth every 6 (six) hours as needed. 01/25/19   Joni Reining, PA-C  oxyCODONE-acetaminophen (PERCOCET) 7.5-325 MG tablet Take 1 tablet by mouth every 6 (six) hours as needed. 01/25/19   Joni Reining, PA-C    Allergies Shellfish allergy    Social History Social History   Tobacco Use  . Smoking status: Current Every Day Smoker    Packs/day: 0.50    Types: Cigarettes  . Smokeless tobacco: Never Used  Substance Use Topics  . Alcohol use: No  . Drug use: No    Review of  Systems Patient denies headaches, rhinorrhea, blurry vision, numbness, shortness of breath, chest pain, edema, cough, abdominal pain, nausea, vomiting, diarrhea, dysuria, fevers, rashes or hallucinations unless otherwise stated above in HPI. ____________________________________________   PHYSICAL EXAM:  VITAL SIGNS: Vitals:   03/22/19 2245 03/22/19 2300  BP: (!) 148/101 (!) 151/90  Pulse: (!) 104 (!) 106  Resp: 18 (!) 23  Temp:    SpO2: 94% 92%    Constitutional: Alert and oriented.  Eyes: Conjunctivae are normal.  Head: Atraumatic. Nose: + nasal congestion Mouth/Throat: Mucous membranes are moist.   Neck: No stridor. Painless ROM.  Cardiovascular: Normal rate, regular rhythm. Grossly normal heart sounds.  Good peripheral circulation. Respiratory: tachypnea with use of accessory muscles, diffuse musical wheeze throughout Gastrointestinal: Soft and nontender. No distention. No abdominal bruits. No CVA tenderness. Genitourinary:  Musculoskeletal: No lower extremity tenderness nor edema.  No joint effusions. Neurologic:  Normal speech and language. No gross focal neurologic deficits are appreciated. No facial droop Skin:  Skin is warm, dry and intact. No rash noted. Psychiatric: Mood and affect are normal. Speech and behavior are normal.  ____________________________________________   LABS (all labs ordered are listed, but only abnormal results are displayed)  Results for orders placed or performed during the hospital encounter of 03/22/19 (from the past 24 hour(s))  CBC with Differential/Platelet  Status: Abnormal   Collection Time: 03/22/19  9:30 PM  Result Value Ref Range   WBC 20.3 (H) 4.0 - 10.5 K/uL   RBC 5.63 4.22 - 5.81 MIL/uL   Hemoglobin 14.9 13.0 - 17.0 g/dL   HCT 42.2 39.0 - 52.0 %   MCV 75.0 (L) 80.0 - 100.0 fL   MCH 26.5 26.0 - 34.0 pg   MCHC 35.3 30.0 - 36.0 g/dL   RDW 13.7 11.5 - 15.5 %   Platelets 319 150 - 400 K/uL   nRBC 0.0 0.0 - 0.2 %    Neutrophils Relative % 76 %   Neutro Abs 15.3 (H) 1.7 - 7.7 K/uL   Lymphocytes Relative 15 %   Lymphs Abs 3.1 0.7 - 4.0 K/uL   Monocytes Relative 6 %   Monocytes Absolute 1.3 (H) 0.1 - 1.0 K/uL   Eosinophils Relative 2 %   Eosinophils Absolute 0.5 0.0 - 0.5 K/uL   Basophils Relative 0 %   Basophils Absolute 0.1 0.0 - 0.1 K/uL   Immature Granulocytes 1 %   Abs Immature Granulocytes 0.10 (H) 0.00 - 0.07 K/uL  Comprehensive metabolic panel     Status: Abnormal   Collection Time: 03/22/19  9:30 PM  Result Value Ref Range   Sodium 139 135 - 145 mmol/L   Potassium 4.7 3.5 - 5.1 mmol/L   Chloride 101 98 - 111 mmol/L   CO2 26 22 - 32 mmol/L   Glucose, Bld 147 (H) 70 - 99 mg/dL   BUN 18 6 - 20 mg/dL   Creatinine, Ser 1.04 0.61 - 1.24 mg/dL   Calcium 9.5 8.9 - 10.3 mg/dL   Total Protein 8.7 (H) 6.5 - 8.1 g/dL   Albumin 4.8 3.5 - 5.0 g/dL   AST 39 15 - 41 U/L   ALT 38 0 - 44 U/L   Alkaline Phosphatase 53 38 - 126 U/L   Total Bilirubin 0.9 0.3 - 1.2 mg/dL   GFR calc non Af Amer >60 >60 mL/min   GFR calc Af Amer >60 >60 mL/min   Anion gap 12 5 - 15  POC SARS Coronavirus 2 Ag     Status: None   Collection Time: 03/22/19 10:22 PM  Result Value Ref Range   SARS Coronavirus 2 Ag NEGATIVE NEGATIVE   ____________________________________________  EKG My review and personal interpretation at Time: 20:59   Indication: sob  Rate: 100  Rhythm: sinus Axis: left Other: nonspecific st abn, lafb, no stemi ____________________________________________  RADIOLOGY  I personally reviewed all radiographic images ordered to evaluate for the above acute complaints and reviewed radiology reports and findings.  These findings were personally discussed with the patient.  Please see medical record for radiology report.  ____________________________________________   PROCEDURES  Procedure(s) performed:  .Critical Care Performed by: Merlyn Lot, MD Authorized by: Merlyn Lot, MD    Critical care provider statement:    Critical care time (minutes):  35   Critical care was necessary to treat or prevent imminent or life-threatening deterioration of the following conditions:  Respiratory failure   Critical care was time spent personally by me on the following activities:  Discussions with consultants, evaluation of patient's response to treatment, examination of patient, ordering and performing treatments and interventions, ordering and review of laboratory studies, ordering and review of radiographic studies, pulse oximetry, re-evaluation of patient's condition, obtaining history from patient or surrogate and review of old charts      Critical Care performed: yes ____________________________________________   INITIAL  IMPRESSION / ASSESSMENT AND PLAN / ED COURSE  Pertinent labs & imaging results that were available during my care of the patient were reviewed by me and considered in my medical decision making (see chart for details).   DDX: Asthma, copd, CHF, pna, ptx, malignancy, Pe, anemia   Westly Hinnant is a 39 y.o. who presents to the ED with symptoms as described above.  Presentation concerning for asthma exacerbation but patient also febrile.  Fairly tachypneic but protecting his airway.  Will give nebulizers order blood work for above differential.  Will give IV steroid.  Clinical Course as of Mar 22 2342  Fri Mar 22, 2019  2304 Patient still with increased work of breathing.  Does feel some improvement after multiple bronchodilators but his O2 saturations are dropping into the 80s requiring supplemental oxygen of 2 L.  Will cover for acute bronchitis and community-acquired pneumonia based on his white count and fever.  Initial Covid is negative.  Will discuss with hospitalist for admission.   [PR]    Clinical Course User Index [PR] Willy Eddy, MD    The patient was evaluated in Emergency Department today for the symptoms described in the history  of present illness. He/she was evaluated in the context of the global COVID-19 pandemic, which necessitated consideration that the patient might be at risk for infection with the SARS-CoV-2 virus that causes COVID-19. Institutional protocols and algorithms that pertain to the evaluation of patients at risk for COVID-19 are in a state of rapid change based on information released by regulatory bodies including the CDC and federal and state organizations. These policies and algorithms were followed during the patient's care in the ED.  As part of my medical decision making, I reviewed the following data within the electronic MEDICAL RECORD NUMBER Nursing notes reviewed and incorporated, Labs reviewed, notes from prior ED visits and  Controlled Substance Database   ____________________________________________   FINAL CLINICAL IMPRESSION(S) / ED DIAGNOSES  Final diagnoses:  Acute respiratory failure with hypoxia (HCC)  Bronchitis      NEW MEDICATIONS STARTED DURING THIS VISIT:  New Prescriptions   No medications on file     Note:  This document was prepared using Dragon voice recognition software and may include unintentional dictation errors.    Willy Eddy, MD 03/22/19 6546    Willy Eddy, MD 03/22/19 [phone]

## 2019-03-22 NOTE — ED Notes (Signed)
Pt SpO2 88%. Placed on 2L [place]

## 2019-03-22 NOTE — ED Triage Notes (Signed)
Patient states that he has a history of asthma and that he has been short of breath times two hours. Patient states that he lost his inhaler. Patient unable to speak in complete sentences and wheezing throughout lung fields.

## 2019-03-23 ENCOUNTER — Other Ambulatory Visit: Payer: Self-pay

## 2019-03-23 ENCOUNTER — Emergency Department: Payer: Self-pay

## 2019-03-23 ENCOUNTER — Encounter: Payer: Self-pay | Admitting: Obstetrics and Gynecology

## 2019-03-23 ENCOUNTER — Inpatient Hospital Stay
Admission: EM | Admit: 2019-03-23 | Discharge: 2019-03-26 | DRG: 202 | Disposition: A | Discharge status: Home / Self Care | Payer: Self-pay | Attending: Internal Medicine | Admitting: Internal Medicine

## 2019-03-23 DIAGNOSIS — D72825 Bandemia: Secondary | ICD-10-CM | POA: Diagnosis present

## 2019-03-23 DIAGNOSIS — E119 Type 2 diabetes mellitus without complications: Secondary | ICD-10-CM

## 2019-03-23 DIAGNOSIS — J45901 Unspecified asthma with (acute) exacerbation: Principal | ICD-10-CM | POA: Diagnosis present

## 2019-03-23 DIAGNOSIS — R7881 Bacteremia: Secondary | ICD-10-CM

## 2019-03-23 DIAGNOSIS — G4733 Obstructive sleep apnea (adult) (pediatric): Secondary | ICD-10-CM | POA: Diagnosis present

## 2019-03-23 DIAGNOSIS — Z91013 Allergy to seafood: Secondary | ICD-10-CM

## 2019-03-23 DIAGNOSIS — F17210 Nicotine dependence, cigarettes, uncomplicated: Secondary | ICD-10-CM | POA: Diagnosis present

## 2019-03-23 DIAGNOSIS — Z79899 Other long term (current) drug therapy: Secondary | ICD-10-CM

## 2019-03-23 DIAGNOSIS — Z6841 Body Mass Index (BMI) 40.0 and over, adult: Secondary | ICD-10-CM

## 2019-03-23 DIAGNOSIS — J9601 Acute respiratory failure with hypoxia: Secondary | ICD-10-CM | POA: Diagnosis present

## 2019-03-23 DIAGNOSIS — J209 Acute bronchitis, unspecified: Secondary | ICD-10-CM | POA: Diagnosis present

## 2019-03-23 DIAGNOSIS — E1165 Type 2 diabetes mellitus with hyperglycemia: Secondary | ICD-10-CM | POA: Diagnosis present

## 2019-03-23 DIAGNOSIS — A419 Sepsis, unspecified organism: Secondary | ICD-10-CM

## 2019-03-23 LAB — LACTIC ACID, PLASMA: Lactic Acid, Venous: 1.4 mmol/L (ref 0.5–1.9)

## 2019-03-23 MED ORDER — PREDNISONE 20 MG PO TABS: 40.0000 mg | ORAL_TABLET | Freq: Every day | ORAL | Status: DC

## 2019-03-23 MED ORDER — ALBUTEROL SULFATE (2.5 MG/3ML) 0.083% IN NEBU: 2.5000 mg | INHALATION_SOLUTION | RESPIRATORY_TRACT | Status: DC | PRN

## 2019-03-23 MED ORDER — SODIUM CHLORIDE 0.9 % IV SOLN
1.0000 g | Freq: Once | INTRAVENOUS | Status: AC
  Administered 2019-03-24: 1 g via INTRAVENOUS
  Filled 2019-03-23: qty 10

## 2019-03-23 MED ORDER — BUDESONIDE 0.25 MG/2ML IN SUSP
0.2500 mg | Freq: Two times a day (BID) | RESPIRATORY_TRACT | Status: DC
  Administered 2019-03-23: 0.25 mg via RESPIRATORY_TRACT
  Filled 2019-03-23: qty 2

## 2019-03-23 MED ORDER — SODIUM CHLORIDE 0.9% FLUSH: 3.0000 mL | INTRAVENOUS | Status: DC | PRN

## 2019-03-23 MED ORDER — NICOTINE 7 MG/24HR TD PT24
7.0000 mg | MEDICATED_PATCH | Freq: Every day | TRANSDERMAL | Status: DC
  Administered 2019-03-23: 7 mg via TRANSDERMAL
  Filled 2019-03-23: qty 1

## 2019-03-23 MED ORDER — IPRATROPIUM-ALBUTEROL 0.5-2.5 (3) MG/3ML IN SOLN
3.0000 mL | Freq: Once | RESPIRATORY_TRACT | Status: AC
  Administered 2019-03-24: 3 mL via RESPIRATORY_TRACT
  Filled 2019-03-23: qty 3

## 2019-03-23 MED ORDER — IPRATROPIUM-ALBUTEROL 0.5-2.5 (3) MG/3ML IN SOLN
3.0000 mL | Freq: Four times a day (QID) | RESPIRATORY_TRACT | Status: DC
  Administered 2019-03-23 (×2): 3 mL via RESPIRATORY_TRACT
  Filled 2019-03-23 (×2): qty 3

## 2019-03-23 MED ORDER — SODIUM CHLORIDE 0.9 % IV SOLN: 250.0000 mL | INTRAVENOUS | Status: DC | PRN

## 2019-03-23 MED ORDER — ALBUTEROL SULFATE HFA 108 (90 BASE) MCG/ACT IN AERS: 2.0000 | INHALATION_SPRAY | Freq: Four times a day (QID) | RESPIRATORY_TRACT | 0 refills | Status: DC | PRN

## 2019-03-23 MED ORDER — METHYLPREDNISOLONE SODIUM SUCC 40 MG IJ SOLR
40.0000 mg | Freq: Four times a day (QID) | INTRAMUSCULAR | Status: DC
  Administered 2019-03-23: 40 mg via INTRAVENOUS
  Filled 2019-03-23: qty 1

## 2019-03-23 MED ORDER — PREDNISONE 20 MG PO TABS: 40.0000 mg | ORAL_TABLET | Freq: Every day | ORAL | 0 refills | Status: DC

## 2019-03-23 MED ORDER — MONTELUKAST SODIUM 10 MG PO TABS: 10.0000 mg | ORAL_TABLET | Freq: Every day | ORAL | 2 refills | Status: AC

## 2019-03-23 MED ORDER — LEVOFLOXACIN 750 MG PO TABS: 750.0000 mg | ORAL_TABLET | Freq: Every day | ORAL | 0 refills | Status: DC

## 2019-03-23 MED ORDER — ARFORMOTEROL TARTRATE 15 MCG/2ML IN NEBU
15.0000 ug | INHALATION_SOLUTION | Freq: Two times a day (BID) | RESPIRATORY_TRACT | Status: DC
  Administered 2019-03-23: 15 ug via RESPIRATORY_TRACT
  Filled 2019-03-23 (×2): qty 2

## 2019-03-23 MED ORDER — ENOXAPARIN SODIUM 40 MG/0.4ML ~~LOC~~ SOLN: 40.0000 mg | Freq: Two times a day (BID) | SUBCUTANEOUS | Status: DC

## 2019-03-23 MED ORDER — SODIUM CHLORIDE 0.9% FLUSH: 3.0000 mL | Freq: Two times a day (BID) | INTRAVENOUS | Status: DC

## 2019-03-23 MED ORDER — METHYLPREDNISOLONE SODIUM SUCC 125 MG IJ SOLR
125.0000 mg | Freq: Once | INTRAMUSCULAR | Status: AC
  Administered 2019-03-24: 125 mg via INTRAVENOUS
  Filled 2019-03-23: qty 2

## 2019-03-23 MED ORDER — SODIUM CHLORIDE 0.9 % IV SOLN
500.0000 mg | Freq: Once | INTRAVENOUS | Status: AC
  Administered 2019-03-24: 500 mg via INTRAVENOUS
  Filled 2019-03-23: qty 500

## 2019-03-23 NOTE — Progress Notes (Signed)
Patient inadvertently pulled out peripheral IV in the right arm but still has the left arm peripheral IV. Patient requested soda and a snack but by the time RN returned to patient's room with drinks/snacks, patient was asleep. Patient complained of no pain or discomfort during the night when in the care of current RN. Will report to oncoming Nurse.

## 2019-03-23 NOTE — ED Triage Notes (Signed)
Pt seen earlier for chest pain. Pt left AMA. Labs back with positive blood cultures. Pt called to come back to the hospital for treatment.

## 2019-03-23 NOTE — ED Notes (Signed)
Pt refuses AM labs.  States he does not want to be stuck again.

## 2019-03-23 NOTE — ED Notes (Signed)
In lieu of Pattricia Boss, Charity fundraiser; pt wants to leave AMA and MD was notified.

## 2019-03-23 NOTE — ED Notes (Signed)
Patient given a pillow and repositioned at this time.  

## 2019-03-23 NOTE — ED Notes (Signed)
Date and time 03/23/2019 at 1924, pt with gram positive cocci in one bottle of aerobic blood cultures per lab. Dr. Silverio Lay notified who states pt needs to return to ED for treatment. Pt notified via telephone of dr. Silverio Lay recommendations. Pt verbalizes understanding.

## 2019-03-23 NOTE — ED Notes (Signed)
Pt states he has a new job tonight that he cannot miss and needs to leave AMA. Paperwork and prescriptions provided in lieu of [place], [place]

## 2019-03-23 NOTE — Progress Notes (Signed)
PHARMACIST - PHYSICIAN COMMUNICATION  CONCERNING:  Enoxaparin (Lovenox) for DVT Prophylaxis    RECOMMENDATION: Patient was prescribed enoxaprin 40mg  q24 hours for VTE prophylaxis.   Filed Weights   03/22/19 2102  Weight: (!) 305 lb (138.3 kg)    Body mass index is 47.77 kg/m.  Estimated Creatinine Clearance: 126.9 mL/min (by C-G formula based on SCr of 1.04 mg/dL).   Based on [facility] policy patient is candidate for enoxaparin 40mg  every 12 hour dosing due to BMI being >40.     DESCRIPTION: Pharmacy has adjusted enoxaparin dose per [facility] policy.  Patient is now receiving enoxaparin 40mg  every 12 hours.     , PharmD Clinical Pharmacist  03/23/2019 12:03 AM

## 2019-03-23 NOTE — ED Provider Notes (Addendum)
[facility] Emergency Department Provider Note  ____________________________________________   First MD Initiated Contact with Patient 03/23/19 2333     (approximate)  I have reviewed the triage vital signs and the nursing notes.   HISTORY  Chief Complaint abnormal labs    HPI Ryan Beck is a 39 y.o. male with below list of previous medical conditions including recent hospital evaluation yesterday secondary to concern for pneumonia with possible sepsis.  Patient had a leukocytosis of 20,000.  Plan admission however patient left AMA secondary to the fact that he started a new job tonight.  Patient was called to return to the emergency department secondary to blood cultures growing gram-positive cocci  patient states that his respiratory status had improved however has worsened since his return to the emergency department.  Patient denies any fever.        Past Medical History:  Diagnosis Date  . Asthma     Patient Active Problem List   Diagnosis Date Noted  . Acute bronchitis 03/24/2019  . Gram-positive cocci bacteremia 03/24/2019  . Type 2 diabetes mellitus without complication ([place]) 73/53/2992  . Asthma, chronic, unspecified asthma severity, with acute exacerbation 03/22/2019  . Hypoxia 03/22/2019  . Morbid obesity ([place]) 03/22/2019  . Elevated hemoglobin A1c 03/22/2019  . Elevated blood pressure reading 03/22/2019  . Obstructive sleep apnea 03/22/2019  . Leukocytosis 03/22/2019  . Acute asthma exacerbation 03/22/2019  . Sepsis ([place]) 10/22/2018    Past Surgical History:  Procedure Laterality Date  . NO PAST SURGERIES      Prior to Admission medications   Medication Sig Start Date End Date Taking? Authorizing Provider  albuterol (VENTOLIN HFA) 108 (90 Base) MCG/ACT inhaler Inhale 2 puffs into the lungs every 6 (six) hours as needed for wheezing or shortness of breath. 03/23/19  Yes Sreenath, Sudheer B, MD  levofloxacin (LEVAQUIN) 750  MG tablet Take 1 tablet (750 mg total) by mouth daily for 7 days. 03/23/19 03/30/19 Yes Sreenath, Sudheer B, MD  montelukast (SINGULAIR) 10 MG tablet Take 1 tablet (10 mg total) by mouth at bedtime. 03/23/19 06/21/19 Yes Sreenath, Sudheer B, MD  predniSONE (DELTASONE) 20 MG tablet Take 2 tablets (40 mg total) by mouth daily for 5 days. 03/23/19 03/28/19 Yes Sidney Ace, MD    Allergies Shellfish allergy  Family History  Problem Relation Age of Onset  . Hypertension Other        The patient reports that almost all family members have high blood pressure    Social History Social History   Tobacco Use  . Smoking status: Current Every Day Smoker    Packs/day: 0.50    Types: Cigarettes  . Smokeless tobacco: Never Used  Substance Use Topics  . Alcohol use: No  . Drug use: No    Review of Systems Constitutional: No fever/chills Eyes: No visual changes. ENT: No sore throat. Cardiovascular: Denies chest pain. Respiratory: Positive for shortness of breath. Gastrointestinal: No abdominal pain.  No nausea, no vomiting.  No diarrhea.  No constipation. Genitourinary: Negative for dysuria. Musculoskeletal: Negative for neck pain.  Negative for back pain. Integumentary: Negative for rash. Neurological: Negative for headaches, focal weakness or numbness.  ____________________________________________   PHYSICAL EXAM:  VITAL SIGNS: ED Triage Vitals  Enc Vitals Group     BP 03/23/19 2109 (!) 163/86     Pulse Rate 03/23/19 2109 93     Resp 03/23/19 2109 18     Temp 03/23/19 2109 98.2 F (36.8 C)  Temp Source 03/23/19 2109 Oral     SpO2 03/23/19 2109 94 %     Weight 03/23/19 2107 (!) 138.3 kg (304 lb 14.3 oz)     Height 03/23/19 2107 1.702 m (5\' 7" )     Head Circumference --      Peak Flow --      Pain Score 03/23/19 2107 0     Pain Loc --      Pain Edu? --      Excl. in GC? --     Constitutional: Alert and oriented.  Eyes: Conjunctivae are normal.  Mouth/Throat:  Patient is wearing a mask. Neck: No stridor.  No meningeal signs.   Cardiovascular: Normal rate, regular rhythm. Good peripheral circulation. Grossly normal heart sounds. Respiratory: Tachypnea, diffuse expiratory wheezing. Gastrointestinal: Soft and nontender. No distention.  Musculoskeletal: No lower extremity tenderness nor edema. No gross deformities of extremities. Neurologic:  Normal speech and language. No gross focal neurologic deficits are appreciated.  Skin:  Skin is warm, dry and intact. Psychiatric: Mood and affect are normal. Speech and behavior are normal.  ____________________________________________   LABS (all labs ordered are listed, but only abnormal results are displayed)  Labs Reviewed  LACTIC ACID, PLASMA - Abnormal; Notable for the following components:      Result Value   Lactic Acid, Venous 2.9 (*)    All other components within normal limits  LACTIC ACID, PLASMA - Abnormal; Notable for the following components:   Lactic Acid, Venous 2.3 (*)    All other components within normal limits  COMPREHENSIVE METABOLIC PANEL - Abnormal; Notable for the following components:   Glucose, Bld 228 (*)    BUN 26 (*)    All other components within normal limits  CBC WITH DIFFERENTIAL/PLATELET - Abnormal; Notable for the following components:   WBC 21.4 (*)    HCT 38.2 (*)    MCV 75.3 (*)    Neutro Abs 15.0 (*)    Monocytes Absolute 2.2 (*)    Abs Immature Granulocytes 0.17 (*)    All other components within normal limits  URINALYSIS, ROUTINE W REFLEX MICROSCOPIC - Abnormal; Notable for the following components:   Color, Urine YELLOW (*)    APPearance CLEAR (*)    Specific Gravity, Urine 1.045 (*)    Protein, ur 30 (*)    All other components within normal limits  CULTURE, BLOOD (ROUTINE X 2)  CULTURE, BLOOD (ROUTINE X 2)  APTT  PROTIME-INR  HIV ANTIBODY (ROUTINE TESTING W REFLEX)  CORTISOL-AM, BLOOD  PROCALCITONIN  HEMOGLOBIN A1C    ____________________________________________  EKG  ED ECG REPORT I, [place] N Demiya Magno, the attending physician, personally viewed and interpreted this ECG.   Date: 03/24/2019  EKG Time: 1:55 AM  Rate: 77  Rhythm: Normal sinus rhythm  Axis: Normal  Intervals:Normal  ST&T Change: None  ____________________________________________  RADIOLOGY I, Biola N Ova Meegan, personally viewed and evaluated these images (plain radiographs) as part of my medical decision making, as well as reviewing the written report by the radiologist.  ED MD interpretation:    Official radiology report(s): DG Chest 2 View  Result Date: 03/24/2019 CLINICAL DATA:  Cough, wheezing, positive blood cultures EXAM: CHEST - 2 VIEW COMPARISON:  Radiograph 03/22/2019 FINDINGS: Minimal airways thickening could reflect reactive airways disease or bronchitis. No consolidation, features of edema, pneumothorax, or effusion. More readily apparent on today's examination is some increased attenuation at the level of the upper mediastinum with questionable retrosternal thickening on the lateral radiograph. IMPRESSION:  Minimal airways thickening could reflect reactive airways disease or bronchitis. No consolidation. Question some increased attenuation at the level of manubrium with retrosternal density on lateral radiograph. In the setting of bacteremia, cannot exclude possible osteomyelitis. Consider further evaluation with CT. These results were called by telephone at the time of interpretation on 03/24/2019 at 12:16 am to provider [facility] , who verbally acknowledged these results. Electronically Signed   By: Kreg Shropshire M.D.   On: 03/24/2019 00:16   CT Chest W Contrast  Result Date: 03/24/2019 CLINICAL DATA:  Bacteremia, radiographic abnormality with possible retrosternal thickening EXAM: CT CHEST WITH CONTRAST TECHNIQUE: Multidetector CT imaging of the chest was performed during intravenous contrast administration. CONTRAST:   29mL OMNIPAQUE IOHEXOL 300 MG/ML  SOLN COMPARISON:  Same-day radiograph FINDINGS: Cardiovascular: Normal heart size. No pericardial effusion. Normal caliber thoracic aorta with normal 3 vessel branching of the aortic arch. Proximal great vessels are unremarkable. Central pulmonary arteries are normal caliber. Exam is not tailored for the luminal evaluation of the pulmonary arteries. Mediastinum/Nodes: No mediastinal fluid or gas. Normal thyroid gland and thoracic inlet. No acute abnormality of the trachea or esophagus. No worrisome mediastinal, hilar or axillary adenopathy. Lungs/Pleura: Mild thickening of the basilar airways may reflect bronchitic change or reactive airways disease. No consolidation, features of edema, pneumothorax, or effusion. No suspicious pulmonary nodules or masses. Upper Abdomen: No acute abnormalities present in the visualized portions of the upper abdomen. Musculoskeletal: Area of radiographic abnormality appears to correspond the sternoclavicular joints and first rib cartilages without abnormal erosive or destructive changes or adjacent swelling or inflammation to suggest active inflammation/infection at this location. Normal bone mineralization. Remaining osseous structures are unremarkable with minimal degenerative change in the spine. IMPRESSION: Area of radiographic abnormality appears to correspond to slightly protuberant first costal cartilages and the overlap of the adjacent sternal costal joints. Appearance is symmetric without features of erosion or destructive change or surrounding phlegmon/inflammation to suggest infection. Mild basilar airways thickening, could reflect a mild bronchitis or reactive airways disease. No other acute intrathoracic abnormality is seen. Electronically Signed   By: Kreg Shropshire M.D.   On: 03/24/2019 01:36     .Critical Care Performed by: Darci Current, MD Authorized by: Darci Current, MD   Critical care provider statement:     Critical care time (minutes):  30   Critical care time was exclusive of:  Separately billable procedures and treating other patients   Critical care was necessary to treat or prevent imminent or life-threatening deterioration of the following conditions:  Sepsis   Critical care was time spent personally by me on the following activities:  Development of treatment plan with patient or surrogate, discussions with consultants, evaluation of patient's response to treatment, examination of patient, obtaining history from patient or surrogate, ordering and performing treatments and interventions, ordering and review of laboratory studies, ordering and review of radiographic studies, pulse oximetry, re-evaluation of patient's condition and review of old charts     ____________________________________________   INITIAL IMPRESSION / MDM / ASSESSMENT AND PLAN / ED COURSE  As part of my medical decision making, I reviewed the following data within the electronic MEDICAL RECORD NUMBER   39 year old male presented with above-stated history and physical exam concern for sepsis.  Sepsis protocol initiated patient's lactic acid today noted to be 2.9 which is increased compared to the patient's lactic acid from yesterday which was 1.4.  Patient also has worsening leukocytosis with concurrent high blood cell count of 21 yesterday  20.  Patient given appropriate IV antibiotic therapy.  Blood cultures obtained again.     ____________________________________________  FINAL CLINICAL IMPRESSION(S) / ED DIAGNOSES  Final diagnoses:  Sepsis, due to unspecified organism, unspecified whether acute organ dysfunction present (HCC)     MEDICATIONS GIVEN DURING THIS VISIT:  Medications  ipratropium-albuterol (DUONEB) 0.5-2.5 (3) MG/3ML nebulizer solution 3 mL (3 mLs Nebulization Given 03/24/19 0319)  albuterol (PROVENTIL) (2.5 MG/3ML) 0.083% nebulizer solution 2.5 mg (has no administration in time range)  predniSONE  (DELTASONE) tablet 40 mg (has no administration in time range)  enoxaparin (LOVENOX) injection 40 mg (has no administration in time range)  lactated ringers infusion (has no administration in time range)  cefTRIAXone (ROCEPHIN) 2 g in sodium chloride 0.9 % 100 mL IVPB (has no administration in time range)  azithromycin (ZITHROMAX) 500 mg in sodium chloride 0.9 % 250 mL IVPB (has no administration in time range)  acetaminophen (TYLENOL) tablet 650 mg (has no administration in time range)    Or  acetaminophen (TYLENOL) suppository 650 mg (has no administration in time range)  ondansetron (ZOFRAN) tablet 4 mg (has no administration in time range)    Or  ondansetron (ZOFRAN) injection 4 mg (has no administration in time range)  vancomycin (VANCOREADY) IVPB 2000 mg/400 mL (2,000 mg Intravenous New Bag/Given 03/24/19 0322)  vancomycin (VANCOREADY) IVPB 1250 mg/250 mL (has no administration in time range)  ipratropium-albuterol (DUONEB) 0.5-2.5 (3) MG/3ML nebulizer solution 3 mL (3 mLs Nebulization Given 03/24/19 0031)  ipratropium-albuterol (DUONEB) 0.5-2.5 (3) MG/3ML nebulizer solution 3 mL (3 mLs Nebulization Given 03/24/19 0031)  methylPREDNISolone sodium succinate (SOLU-MEDROL) 125 mg/2 mL injection 125 mg (125 mg Intravenous Given 03/24/19 0031)  cefTRIAXone (ROCEPHIN) 1 g in sodium chloride 0.9 % 100 mL IVPB (0 g Intravenous Stopped 03/24/19 0103)  azithromycin (ZITHROMAX) 500 mg in sodium chloride 0.9 % 250 mL IVPB (0 mg Intravenous Stopped 03/24/19 0241)  iohexol (OMNIPAQUE) 300 MG/ML solution 75 mL (75 mLs Intravenous Contrast Given 03/24/19 0114)  0.9 %  sodium chloride infusion ( Intravenous Stopped 03/24/19 0228)  lactated ringers bolus 1,000 mL (0 mLs Intravenous Stopped 03/24/19 0416)    And  lactated ringers bolus 1,000 mL (0 mLs Intravenous Stopped 03/24/19 0440)    And  lactated ringers bolus 200 mL (200 mLs Intravenous New Bag/Given 03/24/19 0440)     ED Discharge Orders    None       *Please note:  Ryan Beck was evaluated in Emergency Department on 03/24/2019 for the symptoms described in the history of present illness. He was evaluated in the context of the global COVID-19 pandemic, which necessitated consideration that the patient might be at risk for infection with the SARS-CoV-2 virus that causes COVID-19. Institutional protocols and algorithms that pertain to the evaluation of patients at risk for COVID-19 are in a state of rapid change based on information released by regulatory bodies including the CDC and federal and state organizations. These policies and algorithms were followed during the patient's care in the ED.  Some ED evaluations and interventions may be delayed as a result of limited staffing during the pandemic.*  Note:  This document was prepared using Dragon voice recognition software and may include unintentional dictation errors.   Darci Current, MD 03/24/19 0929    Darci Current, MD 03/24/19 [id number]

## 2019-03-23 NOTE — Discharge Summary (Signed)
Patient was seen and examined by this provider this morning.  Had made progress in terms of acute asthma exacerbation management.  Was still requiring 2 to 3 L nasal cannula at time of my evaluation this morning.  Lung sounds still diminished with some scattered wheezes and adventitious sounds bilaterally.  Time of my evaluation this morning and recommended continued intravenous steroids, escalated nebulizer regimen and inpatient admission.  Patient expressed understanding and agreed at time of my evaluation  Approximately 1500 this afternoon I received word that the patient needed to leave.  He cited a new job starting tonight that he could not miss.  Unfortunately the patient is not medically stable for me to be comfortable discharging at this time.  Patient has decision-making capability and thus allowed to leave AGAINST MEDICAL ADVICE.  I did agree to write prescriptions.  I have provided prescriptions for steroids, albuterol MDI, Singulair, empiric Levaquin.  Patient is a high risk for readmission.  Lolita Patella MD

## 2019-03-23 NOTE — H&P (Signed)
History and Physical    Ryan Beck TWS:[id number] DOB: May 30, 1980 DOA: 03/22/2019  PCP: Patient, No Pcp Per  Patient coming from: home   Chief Complaint: shortness of breath  HPI: Ryan Beck is a 39 y.o. male with medical history significant for morbid obesity, chronic asthma, recently-diagnosed likely t2dm, likely OSA, who presents with above.  Symptoms began relatively suddenly this afternoon. While doing chores around the home developed severe shortness of breath and wheeze. Also come coughing. No fevers. No chest pain. Denies aspiration. Not on daily controller meds, doesn't have a PCP. Admitted for asthma with CAP in October of last year. No recent immobilization, no hemoptysis. No history of PE.   ED Course: methylpred, duonebs, magnesium, labs, cxr, [place] o2. O2 mid 80s on arrival, improved to 90s with 2 L [place] O2. Nursing witnessed desat to mid-80s when patient fell asleep, recovered when awakened.  Review of Systems: As per HPI otherwise 10 point review of systems negative.    Past Medical History:  Diagnosis Date  . Asthma     Past Surgical History:  Procedure Laterality Date  . NO PAST SURGERIES       reports that he has been smoking cigarettes. He has been smoking about 0.50 packs per day. He has never used smokeless tobacco. He reports that he does not drink alcohol or use drugs.  Allergies  Allergen Reactions  . Shellfish Allergy Swelling    Family History  Problem Relation Age of Onset  . Hypertension Other        The patient reports that almost all family members have high blood pressure    Prior to Admission medications   Medication Sig Start Date End Date Taking? Authorizing Provider  cyclobenzaprine (FLEXERIL) 10 MG tablet Take 1 tablet (10 mg total) by mouth 3 (three) times daily as needed. 01/25/19   Joni Reining, PA-C  ketorolac (TORADOL) 10 MG tablet Take 1 tablet (10 mg total) by mouth every 6 (six) hours as needed. 01/25/19   Joni Reining, PA-C  oxyCODONE-acetaminophen (PERCOCET) 7.5-325 MG tablet Take 1 tablet by mouth every 6 (six) hours as needed. 01/25/19   Joni Reining, PA-C    Physical Exam: Vitals:   03/22/19 2230 03/22/19 2245 03/22/19 2300 03/22/19 2330  BP: (!) 172/95 (!) 148/101 (!) 151/90 (!) 163/107  Pulse: 93 (!) 104 (!) 106 96  Resp:  18 (!) 23 (!) 23  Temp:      SpO2: 92% 94% 92% 91%  Weight:      Height:        Constitutional: No acute distress, morbidly obese. Head: Atraumatic Eyes: Conjunctiva clear ENM: Moist mucous membranes. Normal dentition.  Neck: Supple Respiratory:  Mild tachypnea. No use of accessory muscles. Inspiratory and exporatory wheezes. Rhonchi. Cardiovascular: Regular rate and rhythm. No murmurs/rubs/gallops. Abdomen: Non-tender, obese. No masses. No rebound or guarding. Positive bowel sounds. Musculoskeletal: No joint deformity upper and lower extremities. Normal ROM, no contractures. Normal muscle tone.  Skin: No rashes, lesions, or ulcers. Many tattoes Extremities: trace LE peripheral edema. Palpable peripheral pulses. Neurologic: Alert, moving all 4 extremities. Psychiatric: Normal insight and judgement.   Labs on Admission: I have personally reviewed following labs and imaging studies  CBC: Recent Labs  Lab 03/22/19 2130  WBC 20.3*  NEUTROABS 15.3*  HGB 14.9  HCT 42.2  MCV 75.0*  PLT 319   Basic Metabolic Panel: Recent Labs  Lab 03/22/19 2130  NA 139  K 4.7  CL 101  CO2 26  GLUCOSE 147*  BUN 18  CREATININE 1.04  CALCIUM 9.5   GFR: Estimated Creatinine Clearance: 126.9 mL/min (by C-G formula based on SCr of 1.04 mg/dL). Liver Function Tests: Recent Labs  Lab 03/22/19 2130  AST 39  ALT 38  ALKPHOS 53  BILITOT 0.9  PROT 8.7*  ALBUMIN 4.8   No results for input(s): LIPASE, AMYLASE in the last 168 hours. No results for input(s): AMMONIA in the last 168 hours. Coagulation Profile: No results for input(s): INR, PROTIME in the last 168  hours. Cardiac Enzymes: No results for input(s): CKTOTAL, CKMB, CKMBINDEX, TROPONINI in the last 168 hours. BNP (last 3 results) No results for input(s): PROBNP in the last 8760 hours. HbA1C: No results for input(s): HGBA1C in the last 72 hours. CBG: No results for input(s): GLUCAP in the last 168 hours. Lipid Profile: No results for input(s): CHOL, HDL, LDLCALC, TRIG, CHOLHDL, LDLDIRECT in the last 72 hours. Thyroid Function Tests: No results for input(s): TSH, T4TOTAL, FREET4, T3FREE, THYROIDAB in the last 72 hours. Anemia Panel: No results for input(s): VITAMINB12, FOLATE, FERRITIN, TIBC, IRON, RETICCTPCT in the last 72 hours. Urine analysis:    Component Value Date/Time   COLORURINE AMBER (A) 10/21/2018 2302   APPEARANCEUR CLOUDY (A) 10/21/2018 2302   LABSPEC 1.027 10/21/2018 2302   PHURINE 5.0 10/21/2018 2302   GLUCOSEU NEGATIVE 10/21/2018 2302   HGBUR MODERATE (A) 10/21/2018 2302   BILIRUBINUR NEGATIVE 10/21/2018 2302   KETONESUR 20 (A) 10/21/2018 2302   PROTEINUR 100 (A) 10/21/2018 2302   NITRITE NEGATIVE 10/21/2018 2302   LEUKOCYTESUR NEGATIVE 10/21/2018 2302    Radiological Exams on Admission: DG Chest Portable 1 View  Result Date: 03/22/2019 CLINICAL DATA:  Shortness of breath, history of asthma, lost inhaler, difficulty speaking EXAM: PORTABLE CHEST 1 VIEW COMPARISON:  Radiograph 10/21/2018 FINDINGS: Mild airways thickening. No consolidation. No pneumothorax or effusion. No convincing features of edema. The cardiomediastinal contours are unremarkable. No acute osseous or soft tissue abnormality. IMPRESSION: Mild airways thickening could reflect features of reactive airways disease or bronchitis. Electronically Signed   By: Lovena Le M.D.   On: 03/22/2019 21:51    EKG: Independently reviewed. Sinus tach  Assessment/Plan Principal Problem:   Asthma, chronic, unspecified asthma severity, with acute exacerbation Active Problems:   Hypoxia   Morbid obesity (HCC)    Elevated hemoglobin A1c   Elevated blood pressure reading   Obstructive sleep apnea   Leukocytosis   Acute asthma exacerbation   # Asthma with acute exacerbation # Hypoxia - here hypoxic, with increased wob. Has partially responded to duonebs, mg, and methylprednisone. Satting mid-90s on 2L [place] not in respiratory distress, speaking in complete sentences. CXR with airway thickening, no focal findings. No chest pain or sig LE edema to suggest mi/chf. Temp borderline and does have what appears to be chronic leukocytosis. No known covid exposures and rapid antigen test is negative. - holding on abx for now; procalcitonin pending - continue prednisone, duonebs, prn albuterol - [place] o2 - f/u covid pcr - blood culture ordered by EDP  # Leukocytosis - unclear to what decree the chronic elevations we see are all in the setting of acute asthma exacerbations and other acute illnesses, or if other chronic problem - will need outpt f/u  # OSA - probable, given obesity, neck circumference, worsening hypoxia with sleep - will order cpap overnight  # elevated bp - in setting of duonebs. May also have underlying htn - ctm  # elevated a1c -  to 6.9 recent admit. Glucose today moderately elevated - repeat a1c, if elevated will confirm dx of t2dm - monitor glucose while receiving steroids  # Many tattoes - f/u hcv  # Tobacco abuse - nicotine patch  DVT prophylaxis: lovenox Code Status: full  Family Communication: mother shirley  Disposition Plan: tbd  Consults called: none  Admission status: med/surg    Silvano Bilis MD [facility] Pager [phone]  If 7PM-7AM, please contact night-coverage [email or web address] Password [email or web address]  03/23/2019, 12:00 AM

## 2019-03-24 ENCOUNTER — Emergency Department: Payer: Self-pay

## 2019-03-24 ENCOUNTER — Encounter: Payer: Self-pay | Admitting: Radiology

## 2019-03-24 DIAGNOSIS — G4733 Obstructive sleep apnea (adult) (pediatric): Secondary | ICD-10-CM

## 2019-03-24 DIAGNOSIS — J45901 Unspecified asthma with (acute) exacerbation: Principal | ICD-10-CM

## 2019-03-24 DIAGNOSIS — R7881 Bacteremia: Secondary | ICD-10-CM

## 2019-03-24 DIAGNOSIS — E119 Type 2 diabetes mellitus without complications: Secondary | ICD-10-CM

## 2019-03-24 DIAGNOSIS — A419 Sepsis, unspecified organism: Secondary | ICD-10-CM

## 2019-03-24 DIAGNOSIS — E1165 Type 2 diabetes mellitus with hyperglycemia: Secondary | ICD-10-CM

## 2019-03-24 DIAGNOSIS — J209 Acute bronchitis, unspecified: Secondary | ICD-10-CM

## 2019-03-24 LAB — CBC WITH DIFFERENTIAL/PLATELET
Abs Immature Granulocytes: 0.17 10*3/uL — ABNORMAL HIGH (ref 0.00–0.07)
Basophils Absolute: 0 10*3/uL (ref 0.0–0.1)
Basophils Relative: 0 %
Eosinophils Absolute: 0 10*3/uL (ref 0.0–0.5)
Eosinophils Relative: 0 %
HCT: 38.2 % — ABNORMAL LOW (ref 39.0–52.0)
Hemoglobin: 13.4 g/dL (ref 13.0–17.0)
Immature Granulocytes: 1 %
Lymphocytes Relative: 19 %
Lymphs Abs: 4 10*3/uL (ref 0.7–4.0)
MCH: 26.4 pg (ref 26.0–34.0)
MCHC: 35.1 g/dL (ref 30.0–36.0)
MCV: 75.3 fL — ABNORMAL LOW (ref 80.0–100.0)
Monocytes Absolute: 2.2 10*3/uL — ABNORMAL HIGH (ref 0.1–1.0)
Monocytes Relative: 10 %
Neutro Abs: 15 10*3/uL — ABNORMAL HIGH (ref 1.7–7.7)
Neutrophils Relative %: 70 %
Platelets: 312 10*3/uL (ref 150–400)
RBC: 5.07 MIL/uL (ref 4.22–5.81)
RDW: 13.7 % (ref 11.5–15.5)
WBC: 21.4 10*3/uL — ABNORMAL HIGH (ref 4.0–10.5)
nRBC: 0 % (ref 0.0–0.2)

## 2019-03-24 LAB — COMPREHENSIVE METABOLIC PANEL
ALT: 32 U/L (ref 0–44)
AST: 31 U/L (ref 15–41)
Albumin: 4.1 g/dL (ref 3.5–5.0)
Alkaline Phosphatase: 45 U/L (ref 38–126)
Anion gap: 9 (ref 5–15)
BUN: 26 mg/dL — ABNORMAL HIGH (ref 6–20)
CO2: 26 mmol/L (ref 22–32)
Calcium: 8.9 mg/dL (ref 8.9–10.3)
Chloride: 102 mmol/L (ref 98–111)
Creatinine, Ser: 1.14 mg/dL (ref 0.61–1.24)
GFR calc Af Amer: >60 mL/min (ref >60)
GFR calc non Af Amer: >60 mL/min (ref >60)
Glucose, Bld: 228 mg/dL — ABNORMAL HIGH (ref 70–99)
Potassium: 4.3 mmol/L (ref 3.5–5.1)
Sodium: 137 mmol/L (ref 135–145)
Total Bilirubin: 0.7 mg/dL (ref 0.3–1.2)
Total Protein: 7.9 g/dL (ref 6.5–8.1)

## 2019-03-24 LAB — URINALYSIS, ROUTINE W REFLEX MICROSCOPIC
Bacteria, UA: NONE SEEN
Bilirubin Urine: NEGATIVE
Glucose, UA: NEGATIVE mg/dL
Hgb urine dipstick: NEGATIVE
Ketones, ur: NEGATIVE mg/dL
Leukocytes,Ua: NEGATIVE
Nitrite: NEGATIVE
Protein, ur: 30 mg/dL — AB
Specific Gravity, Urine: 1.045 — ABNORMAL HIGH (ref 1.005–1.030)
pH: 5 (ref 5.0–8.0)

## 2019-03-24 LAB — PROCALCITONIN: Procalcitonin: <0.1 ng/mL

## 2019-03-24 LAB — LACTIC ACID, PLASMA
Lactic Acid, Venous: 2.9 mmol/L (ref 0.5–1.9)
Lactic Acid, Venous: 2.3 mmol/L (ref 0.5–1.9)

## 2019-03-24 LAB — CORTISOL-AM, BLOOD: Cortisol - AM: 2.8 ug/dL — ABNORMAL LOW (ref 6.7–22.6)

## 2019-03-24 LAB — PROTIME-INR
INR: 1.1 (ref 0.8–1.2)
Prothrombin Time: 14.3 seconds (ref 11.4–15.2)

## 2019-03-24 LAB — APTT: aPTT: 33 seconds (ref 24–36)

## 2019-03-24 LAB — HIV ANTIBODY (ROUTINE TESTING W REFLEX): HIV Screen 4th Generation wRfx: NONREACTIVE

## 2019-03-24 LAB — HEMOGLOBIN A1C
Hgb A1c MFr Bld: 8 % — ABNORMAL HIGH (ref 4.8–5.6)
Mean Plasma Glucose: 182.9 mg/dL

## 2019-03-24 MED ORDER — SODIUM CHLORIDE 0.9 % IV SOLN
500.0000 mg | INTRAVENOUS | Status: DC
  Administered 2019-03-24: 500 mg via INTRAVENOUS
  Filled 2019-03-24 (×2): qty 500

## 2019-03-24 MED ORDER — BUDESONIDE 0.25 MG/2ML IN SUSP
0.2500 mg | Freq: Two times a day (BID) | RESPIRATORY_TRACT | Status: DC
  Administered 2019-03-24 – 2019-03-26 (×4): 0.25 mg via RESPIRATORY_TRACT
  Filled 2019-03-24 (×4): qty 2

## 2019-03-24 MED ORDER — LACTATED RINGERS IV SOLN: INTRAVENOUS | Status: DC

## 2019-03-24 MED ORDER — ALBUTEROL SULFATE (2.5 MG/3ML) 0.083% IN NEBU: 2.5000 mg | INHALATION_SOLUTION | RESPIRATORY_TRACT | Status: DC | PRN

## 2019-03-24 MED ORDER — SODIUM CHLORIDE 0.9 % IV SOLN: 2.0000 g | INTRAVENOUS | Status: DC

## 2019-03-24 MED ORDER — IPRATROPIUM-ALBUTEROL 0.5-2.5 (3) MG/3ML IN SOLN
3.0000 mL | Freq: Four times a day (QID) | RESPIRATORY_TRACT | Status: DC
  Administered 2019-03-24 – 2019-03-26 (×9): 3 mL via RESPIRATORY_TRACT
  Filled 2019-03-24 (×11): qty 3

## 2019-03-24 MED ORDER — LACTATED RINGERS IV BOLUS (SEPSIS)
1000.0000 mL | Freq: Once | INTRAVENOUS | Status: AC
  Administered 2019-03-24: 1000 mL via INTRAVENOUS

## 2019-03-24 MED ORDER — IOHEXOL 300 MG/ML  SOLN
75.0000 mL | Freq: Once | INTRAMUSCULAR | Status: AC | PRN
  Administered 2019-03-24: 75 mL via INTRAVENOUS

## 2019-03-24 MED ORDER — VANCOMYCIN HCL 1250 MG/250ML IV SOLN: 1250.0000 mg | Freq: Two times a day (BID) | INTRAVENOUS | Status: DC

## 2019-03-24 MED ORDER — ACETAMINOPHEN 325 MG PO TABS
650.0000 mg | ORAL_TABLET | Freq: Four times a day (QID) | ORAL | Status: DC | PRN
  Administered 2019-03-26: 650 mg via ORAL
  Filled 2019-03-24: qty 2

## 2019-03-24 MED ORDER — ENOXAPARIN SODIUM 40 MG/0.4ML ~~LOC~~ SOLN
40.0000 mg | Freq: Two times a day (BID) | SUBCUTANEOUS | Status: DC
  Administered 2019-03-24 – 2019-03-26 (×4): 40 mg via SUBCUTANEOUS
  Filled 2019-03-24 (×5): qty 0.4

## 2019-03-24 MED ORDER — ONDANSETRON HCL 4 MG PO TABS: 4.0000 mg | ORAL_TABLET | Freq: Four times a day (QID) | ORAL | Status: DC | PRN

## 2019-03-24 MED ORDER — METHYLPREDNISOLONE SODIUM SUCC 40 MG IJ SOLR
40.0000 mg | Freq: Two times a day (BID) | INTRAMUSCULAR | Status: DC
  Administered 2019-03-24 – 2019-03-25 (×3): 40 mg via INTRAVENOUS
  Filled 2019-03-24 (×3): qty 1

## 2019-03-24 MED ORDER — ACETAMINOPHEN 650 MG RE SUPP: 650.0000 mg | Freq: Four times a day (QID) | RECTAL | Status: DC | PRN

## 2019-03-24 MED ORDER — VANCOMYCIN HCL 2000 MG/400ML IV SOLN
2000.0000 mg | Freq: Once | INTRAVENOUS | Status: AC
  Administered 2019-03-24: 2000 mg via INTRAVENOUS
  Filled 2019-03-24: qty 400

## 2019-03-24 MED ORDER — SODIUM CHLORIDE 0.9 % IV SOLN: Freq: Once | INTRAVENOUS | Status: AC

## 2019-03-24 MED ORDER — LACTATED RINGERS IV BOLUS (SEPSIS)
200.0000 mL | Freq: Once | INTRAVENOUS | Status: AC
  Administered 2019-03-24: 200 mL via INTRAVENOUS

## 2019-03-24 MED ORDER — PREDNISONE 20 MG PO TABS: 40.0000 mg | ORAL_TABLET | Freq: Every day | ORAL | Status: DC

## 2019-03-24 MED ORDER — ONDANSETRON HCL 4 MG/2ML IJ SOLN: 4.0000 mg | Freq: Four times a day (QID) | INTRAMUSCULAR | Status: DC | PRN

## 2019-03-24 MED ORDER — ARFORMOTEROL TARTRATE 15 MCG/2ML IN NEBU
15.0000 ug | INHALATION_SOLUTION | Freq: Two times a day (BID) | RESPIRATORY_TRACT | Status: DC
  Administered 2019-03-24 – 2019-03-26 (×4): 15 ug via RESPIRATORY_TRACT
  Filled 2019-03-24 (×6): qty 2

## 2019-03-24 NOTE — ED Notes (Signed)
Dr. Georgeann Oppenheim at bedside to talk to patient about plan of care, advised patient that if he felt good enough to leave he would discharge or that if he wanted to stay the night and be re-evaluated in the morning by his partner. Pt opted at this time to stay over night to make sure nothing was wrong.

## 2019-03-24 NOTE — Progress Notes (Signed)
CODE SEPSIS - PHARMACY COMMUNICATION  **Broad Spectrum Antibiotics should be administered within 1 hour of Sepsis diagnosis**  Time Code Sepsis Called/Page Received: 2352  Antibiotics Ordered: Rocephin and Zithromax  Time of 1st antibiotic administration: 0024, Rocephin  Additional action taken by pharmacy: n/a  If necessary, Name of Provider/Nurse Contacted: n/a    Wayland Denis ,PharmD Clinical Pharmacist  03/24/2019  12:42 AM

## 2019-03-24 NOTE — ED Notes (Signed)
Pt placed on 2LPM [place] due to spO2 reading 89-91% on RA. Will continue to monitor

## 2019-03-24 NOTE — Progress Notes (Signed)
Pharmacy Antibiotic Note  Ryan Beck is a 39 y.o. male admitted on 03/23/2019 with bacteremia.  Pharmacy has been consulted for Vancomycin dosing.  Pt received Vanc 2gm loading dose  Plan: Vancomycin 1250 mg IV Q 12 hrs. Goal AUC 400-550. Expected AUC: 507.5, Css 14.9 SCr used: 1.14   Height: 5\' 7"  (170.2 cm) Weight: (!) 304 lb 14.3 oz (138.3 kg) IBW/kg (Calculated) : 66.1  Temp (24hrs), Avg:98.6 F (37 C), Min:98.2 F (36.8 C), Max:99 F (37.2 C)  Recent Labs  Lab 03/22/19 2130 03/22/19 2333 03/24/19 0014 03/24/19 0149  WBC 20.3*  --   --  21.4*  CREATININE 1.04  --   --  1.14  LATICACIDVEN  --  1.4 2.9* 2.3*    Estimated Creatinine Clearance: 115.7 mL/min (by C-G formula based on SCr of 1.14 mg/dL).    Allergies  Allergen Reactions  . Shellfish Allergy Swelling    Antimicrobials this admission:   >>    >>   Dose adjustments this admission:   Microbiology results:  BCx:   UCx:    Sputum:    MRSA PCR:   Thank you for allowing pharmacy to be a part of this patient's care.  03/26/19 A 03/24/2019 3:54 AM

## 2019-03-24 NOTE — ED Notes (Signed)
Pt to ct 

## 2019-03-24 NOTE — ED Notes (Signed)
Pt laying on stretcher talking on phone, informed patient unsure when his bed will be ready, pt alert and oriented, respirations equal and unlabored.  Pt currently on room air at this time.  Pt saline locked at this time.  Pt ate 100% of breakfast and reports he has been up to the bathroom.

## 2019-03-24 NOTE — ED Notes (Signed)
Pt assisted with repositioning in bed at this time, lights dimmed and pillow provided, call light in reach

## 2019-03-24 NOTE — Progress Notes (Addendum)
Brief hospitalist update note.  Nonbillable note.   Please see same-day H&P by Dr. Lindajo Royal for full details.  Patient is a 39 year old male with known asthma.  Initially presented to the emergency department yesterday 03/23/2019 with complaints of wheezing, shortness of breath, hypoxia.  Findings consistent with acute asthma exacerbation.  Blood cultures were taken at that time and patient was admitted and started on treatment for acute asthma however he elected to leave AGAINST MEDICAL ADVICE given concerns about a new job he had just started.  After the patient left he was called back to the emergency department due to 1 out of 2 blood cultures positive for gram-positive cocci.  Upon representation sepsis protocol was initiated.  Patient received broad-spectrum antibiotics, aggressive intravenous fluids, sepsis bundle work-up  Followed the blood cultures.  1 out of 2 positive for coagulase-negative staph.  This is more than likely a contaminant.  Patient is not septic or toxic appearing on my arrival.  His presentation continues to be consistent with acute asthma exacerbation and acute hypoxic respiratory failure  Plan: DC antibiotics DC IV fluids IV steroids for today 40 mg every 12 hours of Solu-Medrol P.o. prednisone 40 mg starting tomorrow DuoNebs every 6 hours scheduled Albuterol every 2 hours as needed Brovana scheduled twice daily Pulmicort scheduled twice daily Wean oxygen as tolerated  Update 1500: I went to reevaluate patient at bedside.  Remains in emergency department.  Weaned from supplemental oxygen.  On repeat auscultation patient is not wheezing however lung sounds remain diminished.  Patient endorses a "popping sensation" upon deep inspiration.  Unclear etiology.  Discussed with patient that the blood culture was a contaminant and likely would not warrant any further inpatient hospitalization.  Patient remains concerned about his breathing.  Considering the recent hypoxia  and continued diminished breath sounds on auscultation will place patient in observation status.  Plan to monitor overnight in house.  If no desaturation or worsening respiratory distress noted overnight anticipate discharge on 03/25/2019.

## 2019-03-24 NOTE — Progress Notes (Signed)
PHARMACIST - PHYSICIAN COMMUNICATION  CONCERNING:  Enoxaparin (Lovenox) for DVT Prophylaxis    RECOMMENDATION: Patient was prescribed enoxaprin 40mg  q24 hours for VTE prophylaxis.   Filed Weights   03/23/19 2107  Weight: (!) 304 lb 14.3 oz (138.3 kg)    Body mass index is 47.75 kg/m.  Estimated Creatinine Clearance: 115.7 mL/min (by C-G formula based on SCr of 1.14 mg/dL).   Based on [facility] policy patient is candidate for enoxaparin 40mg  every 12 hour dosing due to BMI being >40.    DESCRIPTION: Pharmacy has adjusted enoxaparin dose per [facility] policy.  Patient is now receiving enoxaparin 40mg  every 12 hours.    , PharmD Clinical Pharmacist  03/24/2019 2:54 AM

## 2019-03-24 NOTE — ED Notes (Signed)
resp notified of need for CPAP per order. States will be brought to pt soon

## 2019-03-24 NOTE — Progress Notes (Signed)
Called to set up cpap for this patient. Arrived to find patient asleep, snoring loud, with very long apneic pauses. Lowest desat noted at 79%. Started patient out on cpap 10, then 5. Activated ramp feature for pressure of 4. Patient immediately pulled away with all attempts and stated he could not breath against pressure. Changed mode to bipap 7/4, lowest possible setting I could achieve. Patient could not tolerate.  Also tried bipap in auto mode to allow him some time to fall off to sleep before pressures would increase. Unfortunately, this patient could not stay on mask a full minute. Educated patient why therapy is important based on signs and symptoms he exhibit. Placed patient back on o2 and reported results to RN

## 2019-03-24 NOTE — H&P (Signed)
History and Physical    Ryan Beck ERD:[id number] DOB: 1980/06/16 DOA: 03/23/2019  PCP: Ryan Beck   Patient coming from: home  I have personally briefly reviewed patient's old medical records in [place]  Chief Complaint: Positive blood cultures, shortness of breath  HPI: Ryan Beck is a 39 y.o. male with medical history significant for asthma, morbid obesity, diabetes diagnosed in October 2020 while hospitalized for pneumonia, who was admitted 1 day ago for asthma exacerbation with hypoxia, signing out AMA because of work commitments, who was called back to the emergency room because of gram-positive cocci growing in 1 of 2 blood cultures.  Patient had presented yesterday with severe onset shortness of breath wheezing and coughing, and was hypoxic with O2 sats in the mid 80s.  He was treated with improvement and decided to sign out.  On his return today he is still wheezing though he feels a bit better.  He denies fever or chills, denies chest pain, leg pain or swelling. ED Course: On arrival he was afebrile but borderline tachycardic at 93 mildly tachypneic at 18 with O2 sat 94% on room air.  His white cell count was noted to be 21,000, up from 20,000 on arrival yesterday.(Of note he also had markedly elevated WBC back in October, suspect related to steroids).  Blood sugar was 229.  Lactic acid 2.9.  Contrast shows mild basilar airways thickening which could reflect a mild bronchitis or reactive airways disease.  Patient received several rounds of DuoNeb as well as IV Solu-Medrol in the emergency room at this visit but continued to wheeze.  Was started on Rocephin and azithromycin as well as sepsis fluids and hospitalist consulted for admission  Review of Systems: As Beck HPI otherwise 10 point review of systems negative.    Past Medical History:  Diagnosis Date  . Asthma     Past Surgical History:  Procedure Laterality Date  . NO PAST SURGERIES       reports that he has been smoking cigarettes. He has been smoking about 0.50 packs Beck day. He has never used smokeless tobacco. He reports that he does not drink alcohol or use drugs.  Allergies  Allergen Reactions  . Shellfish Allergy Swelling    Family History  Problem Relation Age of Onset  . Hypertension Other        The patient reports that almost all family members have high blood pressure     Prior to Admission medications   Medication Sig Start Date End Date Taking? Authorizing Provider  albuterol (VENTOLIN HFA) 108 (90 Base) MCG/ACT inhaler Inhale 2 puffs into the lungs every 6 (six) hours as needed for wheezing or shortness of breath. 03/23/19  Yes Sreenath, Sudheer B, MD  levofloxacin (LEVAQUIN) 750 MG tablet Take 1 tablet (750 mg total) by mouth daily for 7 days. 03/23/19 03/30/19 Yes Sreenath, Sudheer B, MD  montelukast (SINGULAIR) 10 MG tablet Take 1 tablet (10 mg total) by mouth at bedtime. 03/23/19 06/21/19 Yes Sreenath, Sudheer B, MD  predniSONE (DELTASONE) 20 MG tablet Take 2 tablets (40 mg total) by mouth daily for 5 days. 03/23/19 03/28/19 Yes Sidney Ace, MD    Physical Exam: Vitals:   03/24/19 0100 03/24/19 0135 03/24/19 0200 03/24/19 0230  BP: 127/82 119/68 (!) 134/92 (!) 140/97  Pulse: 85 90 86 89  Resp: 20 19 15 14   Temp:      TempSrc:      SpO2: 98% 95% 94% 98%  Weight:  Height:         Vitals:   03/24/19 0100 03/24/19 0135 03/24/19 0200 03/24/19 0230  BP: 127/82 119/68 (!) 134/92 (!) 140/97  Pulse: 85 90 86 89  Resp: 20 19 15 14   Temp:      TempSrc:      SpO2: 98% 95% 94% 98%  Weight:      Height:        Constitutional: Somnolent but easily arousable.  Oriented x3 when awakened Eyes: PERLA, EOMI, irises appear normal, anicteric sclera,  ENMT: external ears and nose appear normal, normal hearing             Lips appears normal, oropharynx mucosa, tongue, posterior pharynx appear normal  Neck: neck appears normal, no masses, normal  ROM, no thyromegaly, no JVD  CVS: S1-S2 clear, no murmur rubs or gallops,  , no carotid bruits, pedal pulses palpable, No LE edema Respiratory: Bilateral wheezing and rhonchi. Respiratory effort somewhat increased no accessory muscle use.  Abdomen: soft nontender, nondistended, normal bowel sounds, no hepatosplenomegaly, no hernias Musculoskeletal: : no cyanosis, clubbing , no contractures or atrophy Neuro: Cranial nerves II-XII intact, sensation, reflexes normal, strength Psych: judgement and insight appear normal, stable mood and affect,  Skin: no rashes or lesions or ulcers, no induration or nodules   Labs on Admission: I have personally reviewed following labs and imaging studies  CBC: Recent Labs  Lab 03/22/19 2130 03/24/19 0149  WBC 20.3* 21.4*  NEUTROABS 15.3* 15.0*  HGB 14.9 13.4  HCT 42.2 38.2*  MCV 75.0* 75.3*  PLT 319 312   Basic Metabolic Panel: Recent Labs  Lab 03/22/19 2130 03/24/19 0149  NA 139 137  K 4.7 4.3  CL 101 102  CO2 26 26  GLUCOSE 147* 228*  BUN 18 26*  CREATININE 1.04 1.14  CALCIUM 9.5 8.9   GFR: Estimated Creatinine Clearance: 115.7 mL/min (by C-G formula based on SCr of 1.14 mg/dL). Liver Function Tests: Recent Labs  Lab 03/22/19 2130 03/24/19 0149  AST 39 31  ALT 38 32  ALKPHOS 53 45  BILITOT 0.9 0.7  PROT 8.7* 7.9  ALBUMIN 4.8 4.1   No results for input(s): LIPASE, AMYLASE in the last 168 hours. No results for input(s): AMMONIA in the last 168 hours. Coagulation Profile: Recent Labs  Lab 03/24/19 0149  INR 1.1   Cardiac Enzymes: No results for input(s): CKTOTAL, CKMB, CKMBINDEX, TROPONINI in the last 168 hours. BNP (last 3 results) No results for input(s): PROBNP in the last 8760 hours. HbA1C: No results for input(s): HGBA1C in the last 72 hours. CBG: No results for input(s): GLUCAP in the last 168 hours. Lipid Profile: No results for input(s): CHOL, HDL, LDLCALC, TRIG, CHOLHDL, LDLDIRECT in the last 72 hours. Thyroid  Function Tests: No results for input(s): TSH, T4TOTAL, FREET4, T3FREE, THYROIDAB in the last 72 hours. Anemia Panel: No results for input(s): VITAMINB12, FOLATE, FERRITIN, TIBC, IRON, RETICCTPCT in the last 72 hours. Urine analysis:    Component Value Date/Time   COLORURINE AMBER (A) 10/21/2018 2302   APPEARANCEUR CLOUDY (A) 10/21/2018 2302   LABSPEC 1.027 10/21/2018 2302   PHURINE 5.0 10/21/2018 2302   GLUCOSEU NEGATIVE 10/21/2018 2302   HGBUR MODERATE (A) 10/21/2018 2302   BILIRUBINUR NEGATIVE 10/21/2018 2302   KETONESUR 20 (A) 10/21/2018 2302   PROTEINUR 100 (A) 10/21/2018 2302   NITRITE NEGATIVE 10/21/2018 2302   LEUKOCYTESUR NEGATIVE 10/21/2018 2302    Radiological Exams on Admission: DG Chest 2 View  Result Date:  03/24/2019 CLINICAL DATA:  Cough, wheezing, positive blood cultures EXAM: CHEST - 2 VIEW COMPARISON:  Radiograph 03/22/2019 FINDINGS: Minimal airways thickening could reflect reactive airways disease or bronchitis. No consolidation, features of edema, pneumothorax, or effusion. More readily apparent on today's examination is some increased attenuation at the level of the upper mediastinum with questionable retrosternal thickening on the lateral radiograph. IMPRESSION: Minimal airways thickening could reflect reactive airways disease or bronchitis. No consolidation. Question some increased attenuation at the level of manubrium with retrosternal density on lateral radiograph. In the setting of bacteremia, cannot exclude possible osteomyelitis. Consider further evaluation with CT. These results were called by telephone at the time of interpretation on 03/24/2019 at 12:16 am to provider [facility] , who verbally acknowledged these results. Electronically Signed   By: Kreg Shropshire M.D.   On: 03/24/2019 00:16   CT Chest W Contrast  Result Date: 03/24/2019 CLINICAL DATA:  Bacteremia, radiographic abnormality with possible retrosternal thickening EXAM: CT CHEST WITH CONTRAST  TECHNIQUE: Multidetector CT imaging of the chest was performed during intravenous contrast administration. CONTRAST:  3mL OMNIPAQUE IOHEXOL 300 MG/ML  SOLN COMPARISON:  Same-day radiograph FINDINGS: Cardiovascular: Normal heart size. No pericardial effusion. Normal caliber thoracic aorta with normal 3 vessel branching of the aortic arch. Proximal great vessels are unremarkable. Central pulmonary arteries are normal caliber. Exam is not tailored for the luminal evaluation of the pulmonary arteries. Mediastinum/Nodes: No mediastinal fluid or gas. Normal thyroid gland and thoracic inlet. No acute abnormality of the trachea or esophagus. No worrisome mediastinal, hilar or axillary adenopathy. Lungs/Pleura: Mild thickening of the basilar airways may reflect bronchitic change or reactive airways disease. No consolidation, features of edema, pneumothorax, or effusion. No suspicious pulmonary nodules or masses. Upper Abdomen: No acute abnormalities present in the visualized portions of the upper abdomen. Musculoskeletal: Area of radiographic abnormality appears to correspond the sternoclavicular joints and first rib cartilages without abnormal erosive or destructive changes or adjacent swelling or inflammation to suggest active inflammation/infection at this location. Normal bone mineralization. Remaining osseous structures are unremarkable with minimal degenerative change in the spine. IMPRESSION: Area of radiographic abnormality appears to correspond to slightly protuberant first costal cartilages and the overlap of the adjacent sternal costal joints. Appearance is symmetric without features of erosion or destructive change or surrounding phlegmon/inflammation to suggest infection. Mild basilar airways thickening, could reflect a mild bronchitis or reactive airways disease. No other acute intrathoracic abnormality is seen. Electronically Signed   By: Kreg Shropshire M.D.   On: 03/24/2019 01:36   DG Chest Portable 1  View  Result Date: 03/22/2019 CLINICAL DATA:  Shortness of breath, history of asthma, lost inhaler, difficulty speaking EXAM: PORTABLE CHEST 1 VIEW COMPARISON:  Radiograph 10/21/2018 FINDINGS: Mild airways thickening. No consolidation. No pneumothorax or effusion. No convincing features of edema. The cardiomediastinal contours are unremarkable. No acute osseous or soft tissue abnormality. IMPRESSION: Mild airways thickening could reflect features of reactive airways disease or bronchitis. Electronically Signed   By: Kreg Shropshire M.D.   On: 03/22/2019 21:51    EKG: Independently reviewed.   Assessment/Plan Principal Problem:   Sepsis (HCC)   Gram-positive cocci bacteremia   Acute bronchitis -Sepsis criteria includes tachycardia and tachypnea with elevated lactic acid level, leukocytosis and source of infection acute bronchitis/pneumonia -Sepsis fluids Beck protocol -IV Rocephin and azithromycin. -Start vancomycin based on gram-positive cocci pending procalcitonin and further speciation -DuoNebs scheduled with as needed albuterol -Prednisone -Supplemental oxygen to keep sats over 92%    Asthma, chronic, unspecified  asthma severity, with acute exacerbation -Management as above with bronchodilators and prednisone -Patient would likely need controller medication at discharge    Morbid obesity (HCC) -This complicates overall prognosis and care -Nutritionist evaluation and lifestyle modification advised    Type 2 diabetes mellitus without complication (HCC) -Patient currently diet controlled -Follow A1c -Nutritionist evaluation    DVT prophylaxis: Lovenox  Code Status: full code  Family Communication:  none  Disposition Plan: Back to previous home environment Consults called: none  Status:inp    Andris Baumann MD [facility]     03/24/2019, 2:40 AM

## 2019-03-24 NOTE — ED Notes (Signed)
Pt asleep, meal tray placed at bedside

## 2019-03-25 ENCOUNTER — Observation Stay: Payer: Self-pay

## 2019-03-25 ENCOUNTER — Encounter: Payer: Self-pay | Admitting: Internal Medicine

## 2019-03-25 DIAGNOSIS — R7881 Bacteremia: Secondary | ICD-10-CM

## 2019-03-25 DIAGNOSIS — D72825 Bandemia: Secondary | ICD-10-CM

## 2019-03-25 DIAGNOSIS — J4521 Mild intermittent asthma with (acute) exacerbation: Secondary | ICD-10-CM

## 2019-03-25 LAB — CBC WITH DIFFERENTIAL/PLATELET
Abs Immature Granulocytes: 0.27 10*3/uL — ABNORMAL HIGH (ref 0.00–0.07)
Basophils Absolute: 0 10*3/uL (ref 0.0–0.1)
Basophils Relative: 0 %
Eosinophils Absolute: 0 10*3/uL (ref 0.0–0.5)
Eosinophils Relative: 0 %
HCT: 38.7 % — ABNORMAL LOW (ref 39.0–52.0)
Hemoglobin: 13.5 g/dL (ref 13.0–17.0)
Immature Granulocytes: 1 %
Lymphocytes Relative: 16 %
Lymphs Abs: 3 10*3/uL (ref 0.7–4.0)
MCH: 26.3 pg (ref 26.0–34.0)
MCHC: 34.9 g/dL (ref 30.0–36.0)
MCV: 75.3 fL — ABNORMAL LOW (ref 80.0–100.0)
Monocytes Absolute: 0.8 10*3/uL (ref 0.1–1.0)
Monocytes Relative: 4 %
Neutro Abs: 15 10*3/uL — ABNORMAL HIGH (ref 1.7–7.7)
Neutrophils Relative %: 79 %
Platelets: 326 10*3/uL (ref 150–400)
RBC: 5.14 MIL/uL (ref 4.22–5.81)
RDW: 13.6 % (ref 11.5–15.5)
WBC: 19.1 10*3/uL — ABNORMAL HIGH (ref 4.0–10.5)
nRBC: 0 % (ref 0.0–0.2)

## 2019-03-25 LAB — CULTURE, BLOOD (ROUTINE X 2): Special Requests: ADEQUATE

## 2019-03-25 LAB — BASIC METABOLIC PANEL
Anion gap: 10 (ref 5–15)
BUN: 24 mg/dL — ABNORMAL HIGH (ref 6–20)
CO2: 24 mmol/L (ref 22–32)
Calcium: 9.1 mg/dL (ref 8.9–10.3)
Chloride: 101 mmol/L (ref 98–111)
Creatinine, Ser: 1.01 mg/dL (ref 0.61–1.24)
GFR calc Af Amer: >60 mL/min (ref >60)
GFR calc non Af Amer: >60 mL/min (ref >60)
Glucose, Bld: 388 mg/dL — ABNORMAL HIGH (ref 70–99)
Potassium: 4.8 mmol/L (ref 3.5–5.1)
Sodium: 135 mmol/L (ref 135–145)

## 2019-03-25 LAB — GLUCOSE, CAPILLARY
Glucose-Capillary: 320 mg/dL — ABNORMAL HIGH (ref 70–99)
Glucose-Capillary: 337 mg/dL — ABNORMAL HIGH (ref 70–99)
Glucose-Capillary: 284 mg/dL — ABNORMAL HIGH (ref 70–99)

## 2019-03-25 MED ORDER — INSULIN ASPART 100 UNIT/ML ~~LOC~~ SOLN
0.0000 [IU] | Freq: Three times a day (TID) | SUBCUTANEOUS | Status: DC
  Administered 2019-03-25 (×2): 15 [IU] via SUBCUTANEOUS
  Administered 2019-03-26: 4 [IU] via SUBCUTANEOUS
  Filled 2019-03-25 (×3): qty 1

## 2019-03-25 MED ORDER — INSULIN GLARGINE 100 UNIT/ML ~~LOC~~ SOLN
10.0000 [IU] | Freq: Every day | SUBCUTANEOUS | Status: DC
  Administered 2019-03-25: 10 [IU] via SUBCUTANEOUS
  Filled 2019-03-25 (×2): qty 0.1

## 2019-03-25 MED ORDER — LIVING WELL WITH DIABETES BOOK
Freq: Once | Status: AC
  Filled 2019-03-25: qty 1

## 2019-03-25 NOTE — Plan of Care (Signed)
  RD consulted for nutrition education regarding new-onset diabetes. Of note patient was diagnosed with DM in 10/2018 and our service provided education for new-onset DM at that time.  Lab Results  Component Value Date   HGBA1C 8.0 (H) 03/24/2019    Met with patient at bedside. He reports he cannot recall meeting with RD before. He reports he has not made any changes to his diet since diagnosis in 10/2018. He typically eats 3-4 meals per day. For breakfast he has eggs with a biscuit. For lunch he has chicken nuggets or a sandwich, typically from fast food. For dinner he has [place] or another meat with vegetables such as collard greens and other sides. He reports he drinks Energy drinks throughout the day and did not realize they had sugar in them until talking with diabetes coordinator today.  RD provided "Carbohydrate Counting for People with Diabetes" handout from the Academy of [employer] and Dietetics. Discussed different food groups and their effects on blood sugar, emphasizing carbohydrate-containing foods. Provided list of carbohydrates and recommended serving sizes of common foods.  Discussed importance of controlled and consistent carbohydrate intake throughout the day. Provided examples of ways to balance meals/snacks and encouraged intake of high-fiber, whole grain complex carbohydrates. Teach back method used.  Expect fair compliance.  Body mass index is 47.75 kg/m. Pt meets criteria for obesity class III based on current BMI.  Current diet order is heart healthy/carbohydrate modified, patient is consuming approximately 80% of meals at this time. Labs and medications reviewed. No further nutrition interventions warranted at this time. RD contact information provided. If additional nutrition issues arise, please re-consult RD.  Jacklynn Barnacle, MS, RD, LDN Pager number available on Amion

## 2019-03-25 NOTE — Progress Notes (Signed)
Nutrition Brief Note  RD received consult for assessment of nutrition requirements/status per COPD protocol  Wt Readings from Last 15 Encounters:  03/23/19 (!) 138.3 kg  03/22/19 (!) 138.3 kg  01/25/19 (!) 142.9 kg  10/21/18 (!) 144.2 kg  07/03/17 (!) 138.3 kg  08/19/15 132.5 kg  08/18/15 132.5 kg  03/23/15 (!) 138.3 kg  12/29/14 133.8 kg  09/05/14 129.3 kg    Met with patient at bedside. Patient reports he has a good appetite and is hungry but does not like the food here. He is eating about 80% of the meals here. At home he typically eats 3-4 meals per day and finishes 100% of meals. He denies any unintentional weight loss and reports he is weight-stable. No subcutaneous fat or muscle wasting found on NFPE. Patient does not meet criteria for malnutrition at this time.  Body mass index is 47.75 kg/m. Patient meets criteria for obesity class III based on current BMI.   Current diet order is heart healthy/carbohydrate modified, patient is consuming approximately 80% of meals at this time. Labs and medications reviewed.   Patient also received consult for diet education (note to follow).  Leanne King, MS, RD, LDN Pager number available on Amion 

## 2019-03-25 NOTE — Progress Notes (Addendum)
Progress Note    Ryan Beck  SNK:[id number] DOB: 1980-08-16  DOA: 03/23/2019 PCP: Patient, No Pcp Per      Brief Narrative:    Medical records reviewed and are as summarized below:       Assessment/Plan:   Principal Problem:   Acute asthma exacerbation Active Problems:   Asthma, chronic, unspecified asthma severity, with acute exacerbation   Morbid obesity (HCC)   Obstructive sleep apnea   Acute bronchitis   Gram-positive cocci bacteremia   Type 2 diabetes mellitus without complication (HCC)   Bandemia   Acute asthma exacerbation: Improving.  Hold IV steroids because of severe hyperglycemia.  Mild acute hypoxemic respiratory failure: Resolved  Type 2 diabetes mellitus with severe hyperglycemia: Hemoglobin A1c is 8.  Patient was educated on the management of diabetes mellitus, hypoglycemia and its complications.  Discussed risk and benefits of treatment.  He is agreeable to Metformin and glipizide/Amaryl.  Use Lantus and NovoLog for glucose control while inpatient.  Monitor glucose levels closely.  Consulted diabetic educator and nutrition for further medication/counseling.  Significant leukocytosis: Etiology unclear but this may be due to acute asthma exacerbation.  Steroids may be contributing.  Of note, patient had significant leukocytosis that was present on admission even before initiating steroid therapy.  Repeat CBC tomorrow to ensure that leukocytosis is not getting worse.  Coagulase-negative staph bacteremia: 1 out of 2 blood culture bottles obtained on 03/22/2019 showed problems negative staph.  This is likely a contaminant.  Antibiotics have been discontinued.  Body mass index is 47.75 kg/m.  (Morbid obesity): Positive lifestyle changes including weight loss, regular exercise and healthy eating habits were reiterated.  Outpatient follow-up with PCP/pulmonologist for sleep study.   Family Communication/Anticipated D/C date and plan/Code Status   DVT  prophylaxis: Lovenox Code Status: Full code Family Communication: Plan discussed with patient Disposition Plan: Patient is from home.  Plan to discharge him home tomorrow leukocytosis and hyperglycemia improved.      Subjective:   No shortness of breath, cough or chest pain.  He feels better.  He said he barely has an asthma attack and he has about 1 attack every 2 years.  He barely uses albuterol inhaler.  He said he just bought a new car and he was supposed to dust when he was cleaning the car.  He believes that is what triggered his asthma attack.   Objective:    Vitals:   03/24/19 2114 03/24/19 2351 03/25/19 0813 03/25/19 1744  BP:  139/82 138/83 (!) 150/89  Pulse:  85 88 80  Resp:  18 20 20   Temp:  98.8 F (37.1 C) 98.5 F (36.9 C) 98.1 F (36.7 C)  TempSrc:  Oral Oral Oral  SpO2: 95% 95% 94% 94%  Weight:      Height:        Intake/Output Summary (Last 24 hours) at 03/25/2019 1811 Last data filed at 03/25/2019 1433 Gross per 24 hour  Intake 240 ml  Output --  Net 240 ml   Filed Weights   03/23/19 2107  Weight: (!) 138.3 kg    Exam:  GEN: NAD, morbidly obese SKIN: No rash EYES: EOMI ENT: MMM CV: RRR PULM: Decreased air entry bilaterally, bilateral expiratory wheezing.  No rales heard ABD: soft, obese, NT, +BS CNS: AAO x 3, non focal EXT: No edema or tenderness   Data Reviewed:   I have personally reviewed following labs and imaging studies:  Labs: Labs show the following:  Basic Metabolic Panel: Recent Labs  Lab 03/22/19 2130 03/22/19 2130 03/24/19 0149 03/25/19 0556  NA 139  --  137 135  K 4.7   < > 4.3 4.8  CL 101  --  102 101  CO2 26  --  26 24  GLUCOSE 147*  --  228* 388*  BUN 18  --  26* 24*  CREATININE 1.04  --  1.14 1.01  CALCIUM 9.5  --  8.9 9.1   < > = values in this interval not displayed.   GFR Estimated Creatinine Clearance: 130.6 mL/min (by C-G formula based on SCr of 1.01 mg/dL). Liver Function Tests: Recent Labs   Lab 03/22/19 2130 03/24/19 0149  AST 39 31  ALT 38 32  ALKPHOS 53 45  BILITOT 0.9 0.7  PROT 8.7* 7.9  ALBUMIN 4.8 4.1   No results for input(s): LIPASE, AMYLASE in the last 168 hours. No results for input(s): AMMONIA in the last 168 hours. Coagulation profile Recent Labs  Lab 03/24/19 0149  INR 1.1    CBC: Recent Labs  Lab 03/22/19 2130 03/24/19 0149 03/25/19 0556  WBC 20.3* 21.4* 19.1*  NEUTROABS 15.3* 15.0* 15.0*  HGB 14.9 13.4 13.5  HCT 42.2 38.2* 38.7*  MCV 75.0* 75.3* 75.3*  PLT 319 312 326   Cardiac Enzymes: No results for input(s): CKTOTAL, CKMB, CKMBINDEX, TROPONINI in the last 168 hours. BNP (last 3 results) No results for input(s): PROBNP in the last 8760 hours. CBG: Recent Labs  Lab 03/25/19 1150 03/25/19 1721  GLUCAP 320* 337*   D-Dimer: No results for input(s): DDIMER in the last 72 hours. Hgb A1c: Recent Labs    03/24/19 0545  HGBA1C 8.0*   Lipid Profile: No results for input(s): CHOL, HDL, LDLCALC, TRIG, CHOLHDL, LDLDIRECT in the last 72 hours. Thyroid function studies: No results for input(s): TSH, T4TOTAL, T3FREE, THYROIDAB in the last 72 hours.  Invalid input(s): FREET3 Anemia work up: No results for input(s): VITAMINB12, FOLATE, FERRITIN, TIBC, IRON, RETICCTPCT in the last 72 hours. Sepsis Labs: Recent Labs  Lab 03/22/19 2130 03/22/19 2333 03/24/19 0014 03/24/19 0149 03/24/19 0545 03/25/19 0556  PROCALCITON  --   --   --   --  <0.10  --   WBC 20.3*  --   --  21.4*  --  19.1*  LATICACIDVEN  --  1.4 2.9* 2.3*  --   --     Microbiology Recent Results (from the past 240 hour(s))  Blood Culture (routine x 2)     Status: None (Preliminary result)   Collection Time: 03/22/19 11:33 PM   Specimen: BLOOD  Result Value Ref Range Status   Specimen Description BLOOD BLOOD RIGHT HAND  Final   Special Requests   Final    BOTTLES DRAWN AEROBIC AND ANAEROBIC Blood Culture adequate volume   Culture   Final    NO GROWTH 3  DAYS Performed at [facility], [address] [place], [place] [address]    Report Status PENDING  Incomplete  Blood Culture (routine x 2)     Status: Abnormal   Collection Time: 03/22/19 11:33 PM   Specimen: BLOOD  Result Value Ref Range Status   Specimen Description   Final    BLOOD BLOOD LEFT HAND Performed at [facility], [address], [place], [place] [address]    Special Requests   Final    BOTTLES DRAWN AEROBIC AND ANAEROBIC Blood Culture adequate volume Performed at [facility], [address] [place], [place] [address]    Culture  Setup Time   Final    GRAM POSITIVE COCCI AEROBIC BOTTLE ONLY CRITICAL RESULT CALLED TO, READ BACK BY AND VERIFIED WITH: BRANDY DAVIS AT 0076 03/23/19.PMF Performed at [facility], [address] [place], [place] [address]    Culture (A)  Final    STAPHYLOCOCCUS SPECIES (COAGULASE NEGATIVE) THE SIGNIFICANCE OF ISOLATING THIS ORGANISM FROM A SINGLE SET OF BLOOD CULTURES WHEN MULTIPLE SETS ARE DRAWN IS UNCERTAIN. PLEASE NOTIFY THE MICROBIOLOGY DEPARTMENT WITHIN ONE WEEK IF SPECIATION AND SENSITIVITIES ARE REQUIRED. Performed at [facility], [address], [place], [place] [address]    Report Status 03/25/2019 FINAL  Final  Blood Culture (routine x 2)     Status: None (Preliminary result)   Collection Time: 03/24/19 12:14 AM   Specimen: BLOOD  Result Value Ref Range Status   Specimen Description BLOOD BLOOD LEFT HAND  Final   Special Requests   Final    BOTTLES DRAWN AEROBIC AND ANAEROBIC Blood Culture adequate volume   Culture   Final    NO GROWTH 1 DAY Performed at [facility], [address] [place], [place] [address]    Report Status PENDING  Incomplete  Blood Culture (routine x 2)     Status: None (Preliminary result)   Collection Time: 03/24/19 12:15 AM   Specimen: BLOOD  Result Value Ref Range Status   Specimen Description BLOOD RIGHT ANTECUBITAL  Final   Special  Requests   Final    BOTTLES DRAWN AEROBIC AND ANAEROBIC Blood Culture adequate volume   Culture   Final    NO GROWTH 1 DAY Performed at [facility], [address] [place], [place] [address]    Report Status PENDING  Incomplete    Procedures and diagnostic studies:  DG Chest 2 View  Result Date: 03/24/2019 CLINICAL DATA:  Cough, wheezing, positive blood cultures EXAM: CHEST - 2 VIEW COMPARISON:  Radiograph 03/22/2019 FINDINGS: Minimal airways thickening could reflect reactive airways disease or bronchitis. No consolidation, features of edema, pneumothorax, or effusion. More readily apparent on today's examination is some increased attenuation at the level of the upper mediastinum with questionable retrosternal thickening on the lateral radiograph. IMPRESSION: Minimal airways thickening could reflect reactive airways disease or bronchitis. No consolidation. Question some increased attenuation at the level of manubrium with retrosternal density on lateral radiograph. In the setting of bacteremia, cannot exclude possible osteomyelitis. Consider further evaluation with CT. These results were called by telephone at the time of interpretation on 03/24/2019 at 12:16 am to provider [facility] , who verbally acknowledged these results. Electronically Signed   By: Kreg Shropshire M.D.   On: 03/24/2019 00:16   CT Chest W Contrast  Result Date: 03/24/2019 CLINICAL DATA:  Bacteremia, radiographic abnormality with possible retrosternal thickening EXAM: CT CHEST WITH CONTRAST TECHNIQUE: Multidetector CT imaging of the chest was performed during intravenous contrast administration. CONTRAST:  97mL OMNIPAQUE IOHEXOL 300 MG/ML  SOLN COMPARISON:  Same-day radiograph FINDINGS: Cardiovascular: Normal heart size. No pericardial effusion. Normal caliber thoracic aorta with normal 3 vessel branching of the aortic arch. Proximal great vessels are unremarkable. Central pulmonary arteries are normal caliber. Exam  is not tailored for the luminal evaluation of the pulmonary arteries. Mediastinum/Nodes: No mediastinal fluid or gas. Normal thyroid gland and thoracic inlet. No acute abnormality of the trachea or esophagus. No worrisome mediastinal, hilar or axillary adenopathy. Lungs/Pleura: Mild thickening of the basilar airways may reflect bronchitic change or  reactive airways disease. No consolidation, features of edema, pneumothorax, or effusion. No suspicious pulmonary nodules or masses. Upper Abdomen: No acute abnormalities present in the visualized portions of the upper abdomen. Musculoskeletal: Area of radiographic abnormality appears to correspond the sternoclavicular joints and first rib cartilages without abnormal erosive or destructive changes or adjacent swelling or inflammation to suggest active inflammation/infection at this location. Normal bone mineralization. Remaining osseous structures are unremarkable with minimal degenerative change in the spine. IMPRESSION: Area of radiographic abnormality appears to correspond to slightly protuberant first costal cartilages and the overlap of the adjacent sternal costal joints. Appearance is symmetric without features of erosion or destructive change or surrounding phlegmon/inflammation to suggest infection. Mild basilar airways thickening, could reflect a mild bronchitis or reactive airways disease. No other acute intrathoracic abnormality is seen. Electronically Signed   By: Lovena Le M.D.   On: 03/24/2019 01:36   DG Chest Port 1 View  Result Date: 03/25/2019 CLINICAL DATA:  39 year old male with a history of asthma exacerbation EXAM: PORTABLE CHEST 1 VIEW COMPARISON:  03/24/2019, 03/22/2019, chest CT 03/24/2019 FINDINGS: Cardiomediastinal silhouette unchanged in size and contour. EKG leads projecting over the chest. No pneumothorax.  No large pleural effusion. No hyperinflation. Coarsened interstitial markings similar to prior. No confluent airspace disease.  No  displaced fracture. IMPRESSION: Similar appearance of the chest with chronic lung changes and no radiographic evidence of acute cardiopulmonary disease. Electronically Signed   By: Corrie Mckusick D.O.   On: 03/25/2019 08:26    Medications:   . arformoterol  15 mcg Nebulization BID  . budesonide (PULMICORT) nebulizer solution  0.25 mg Nebulization BID  . enoxaparin (LOVENOX) injection  40 mg Subcutaneous Q12H  . insulin aspart  0-20 Units Subcutaneous TID WC  . insulin glargine  10 Units Subcutaneous QHS  . ipratropium-albuterol  3 mL Nebulization Q6H   Continuous Infusions:   LOS: 1 day   Bryonna Sundby  [facility]     03/25/2019, 6:11 PM

## 2019-03-25 NOTE — TOC Progression Note (Signed)
Transition of Care [facility]) - Progression Note    Patient Details  Name: Ryan Beck MRN: [id number] Date of Birth: [birth year]  Transition of Care [facility]) CM/SW Contact  Barrie Dunker, RN Phone Number: 03/25/2019, 3:29 PM  Clinical Narrative:    Spoke with the patient at the bedside I provided him with a purple resource booklet for resources in [facility], I provided him with a glucometer He has medicaid and is set up with the [facility], he also gets his medications from [facility], He has transportation, He is independent He does not have additional needs,    Expected Discharge Plan: Home/Self Care Barriers to Discharge: Barriers Resolved  Expected Discharge Plan and Services Expected Discharge Plan: Home/Self Care   Discharge Planning Services: CM Consult, Other - See comment(glucometer)   Living arrangements for the past 2 months: Single Family Home                                       Social Determinants of Health (SDOH) Interventions    Readmission Risk Interventions No flowsheet data found.

## 2019-03-25 NOTE — Progress Notes (Addendum)
Inpatient Diabetes Program Recommendations  AACE/ADA: New Consensus Statement on Inpatient Glycemic Control   Target Ranges:  Prepandial:   less than 140 mg/dL      Peak postprandial:   less than 180 mg/dL (1-2 hours)      Critically ill patients:  140 - 180 mg/dL  Results for Ryan Beck, Ryan Beck (MRN [id number]) as of 03/25/2019 11:06  Ref. Range 03/22/2019 21:30 03/24/2019 01:49 03/25/2019 05:56  Glucose Latest Ref Range: 70 - 99 mg/dL 681 (H) 157 (H) 262 (H)   Results for Ryan Beck, Ryan Beck (MRN [id number]) as of 03/25/2019 11:06  Ref. Range 10/22/2018 07:19 03/24/2019 05:45  Hemoglobin A1C Latest Ref Range: 4.8 - 5.6 % 6.9 (H) 8.0 (H)   Review of Glycemic Control  Diabetes history: DM2 (dx during last hospital admission in October 2020) Outpatient Diabetes medications: None Current orders for Inpatient glycemic control: Novolog 0-20 units TID with meals  Inpatient Diabetes Program Recommendations:   Correction (SSI): Please consider adding Novolog 0-5 units QHS.  Oral Agents: May want to consider discharging on Metformin 500 mg BID and Amaryl 1 mg daily if appropriate.  HgbA1C: A1C 8% on 03/24/19 indicating an average glucose of 183 mg/dl over the past 2-3 months. Noted prior A1C of 6.9% on 10/22/18 and per discharge summary on 10/23/18, patient was dx with new onset DM2 during last hospital admission.  NOTE: Noted consult for hyperglycemia. Chart reviewed. Noted last dose of Solumedrol 40 mg given today at 8:53 am and lab glucose 388 mg/dl today. Novolog correction scale just ordered. Noted patient was newly dx with DM2 during last hospitalization per discharge summary on 10/23/18. Patient was advised to change diet and exercise and asked to get a PCP for follow up. Per chart, patient has no insurance an no PCP listed. Will plan to see patient today.   Addendum 03/25/19@13 :41- Spoke with patient about new diabetes diagnosis.  Inquired about elevated A1C during last admission in October and  patient stated that they told him his sugar was high and that he needed to change his diet and exercise but he was not told that he had DM.  Patient reports that "nearly all my family has diabetes."  Patient states he was not able to get an appointment at any clinic after discharge in October (stated "it was 3-4 months out with appointments and I forgot to follow up").  Discussed A1C results (8% on 03/24/19) and explained what an A1C is and informed patient that his current A1C indicates an average glucose of 183 mg/dl over the past 2-3 months.  Explained that prior A1C was 6.9% on 10/22/18.  Discussed basic pathophysiology of DM Type 2, basic home care, importance of checking CBGs and maintaining good CBG control to prevent long-term and short-term complications. Reviewed glucose and A1C goals. Reviewed signs and symptoms of hyperglycemia and hypoglycemia along with treatment for both. Discussed impact of nutrition, exercise, stress, sickness, and medications on diabetes control.  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 Patient also notes that he has never looked at carbohydrates on food or drink in the past.   Informed patient that RD would be talking with him further about Carb Modified diet.  Informed patient that he would be receiving a Living Well with diabetes booklet and encouraged patient to read through entire book once received.  Patient does not have PCP or insurance. Informed patient that TOC would  be working with him to arrange follow up and assist with medications if needed and will be asked to provide a glucometer and testing supplies if available.   Patient verbalized understanding of information discussed and he states that he has no further questions at this time related to diabetes.   RNs to provide ongoing basic DM education at bedside with this patient and engage patient to actively check blood glucose.    Thanks, Barnie Alderman, RN, MSN, CDE Diabetes Coordinator Inpatient Diabetes Program [phone] (Team Pager from 8am to 5pm)

## 2019-03-26 LAB — BASIC METABOLIC PANEL
Anion gap: 10 (ref 5–15)
BUN: 25 mg/dL — ABNORMAL HIGH (ref 6–20)
CO2: 26 mmol/L (ref 22–32)
Calcium: 9.1 mg/dL (ref 8.9–10.3)
Chloride: 102 mmol/L (ref 98–111)
Creatinine, Ser: 1.02 mg/dL (ref 0.61–1.24)
GFR calc Af Amer: >60 mL/min (ref >60)
GFR calc non Af Amer: >60 mL/min (ref >60)
Glucose, Bld: 222 mg/dL — ABNORMAL HIGH (ref 70–99)
Potassium: 3.9 mmol/L (ref 3.5–5.1)
Sodium: 138 mmol/L (ref 135–145)

## 2019-03-26 LAB — CBC WITH DIFFERENTIAL/PLATELET
Abs Immature Granulocytes: 0.17 10*3/uL — ABNORMAL HIGH (ref 0.00–0.07)
Basophils Absolute: 0 10*3/uL (ref 0.0–0.1)
Basophils Relative: 0 %
Eosinophils Absolute: 0 10*3/uL (ref 0.0–0.5)
Eosinophils Relative: 0 %
HCT: 40.6 % (ref 39.0–52.0)
Hemoglobin: 14 g/dL (ref 13.0–17.0)
Immature Granulocytes: 1 %
Lymphocytes Relative: 40 %
Lymphs Abs: 7.9 10*3/uL — ABNORMAL HIGH (ref 0.7–4.0)
MCH: 26.3 pg (ref 26.0–34.0)
MCHC: 34.5 g/dL (ref 30.0–36.0)
MCV: 76.2 fL — ABNORMAL LOW (ref 80.0–100.0)
Monocytes Absolute: 1.5 10*3/uL — ABNORMAL HIGH (ref 0.1–1.0)
Monocytes Relative: 7 %
Neutro Abs: 10 10*3/uL — ABNORMAL HIGH (ref 1.7–7.7)
Neutrophils Relative %: 52 %
Platelets: 345 10*3/uL (ref 150–400)
RBC: 5.33 MIL/uL (ref 4.22–5.81)
RDW: 13.4 % (ref 11.5–15.5)
Smear Review: NORMAL
WBC: 19.5 10*3/uL — ABNORMAL HIGH (ref 4.0–10.5)
nRBC: 0 % (ref 0.0–0.2)

## 2019-03-26 LAB — PATHOLOGIST SMEAR REVIEW

## 2019-03-26 LAB — GLUCOSE, CAPILLARY: Glucose-Capillary: 191 mg/dL — ABNORMAL HIGH (ref 70–99)

## 2019-03-26 MED ORDER — GLIPIZIDE ER 5 MG PO TB24: 5.0000 mg | ORAL_TABLET | Freq: Every day | ORAL | 0 refills | Status: DC

## 2019-03-26 MED ORDER — PREDNISONE 20 MG PO TABS: 40.0000 mg | ORAL_TABLET | Freq: Every day | ORAL | 0 refills | Status: AC

## 2019-03-26 MED ORDER — METFORMIN HCL 500 MG PO TABS: 500.0000 mg | ORAL_TABLET | Freq: Two times a day (BID) | ORAL | 0 refills | Status: DC

## 2019-03-26 MED ORDER — PREDNISONE 20 MG PO TABS
40.0000 mg | ORAL_TABLET | Freq: Once | ORAL | Status: AC
  Administered 2019-03-26: 40 mg via ORAL
  Filled 2019-03-26: qty 2

## 2019-03-26 MED ORDER — LISINOPRIL 10 MG PO TABS: 10.0000 mg | ORAL_TABLET | Freq: Every day | ORAL | 0 refills | Status: AC

## 2019-03-26 MED ORDER — BLOOD GLUCOSE METER KIT: PACK | 0 refills | Status: AC

## 2019-03-26 MED ORDER — LISINOPRIL 10 MG PO TABS
10.0000 mg | ORAL_TABLET | Freq: Every day | ORAL | Status: DC
  Administered 2019-03-26: 10 mg via ORAL
  Filled 2019-03-26: qty 1

## 2019-03-26 NOTE — TOC Transition Note (Signed)
Transition of Care [facility]) - CM/SW Discharge Note   Patient Details  Name: Ryan Beck MRN: [id number] Date of Birth: Jul 17, 1980  Transition of Care [facility]) CM/SW Contact:  Barrie Dunker, RN Phone Number: 03/26/2019, 10:00 AM   Clinical Narrative:    The patient is independent at home and does not need HH services, he is already [facility] and gets his medications there as well, he has Medicaid, he was provided with a resource booklet for [employer] and provided a Glucometer to check his blood sugars, He has no additional needs and is ready to DC   Final next level of care: Home/Self Care Barriers to Discharge: Barriers Resolved   Patient Goals and CMS Choice Patient states their goals for this hospitalization and ongoing recovery are:: go home      Discharge Placement                       Discharge Plan and Services   Discharge Planning Services: CM Consult, Other - See comment(glucometer)                                 Social Determinants of Health (SDOH) Interventions     Readmission Risk Interventions No flowsheet data found.

## 2019-03-26 NOTE — Discharge Summary (Signed)
Physician Discharge Summary  Ryan Beck MLY:[id number] DOB: [birth year] DOA: 03/23/2019  PCP: Patient, No Pcp Per  Admit date: 03/23/2019 Discharge date: 03/26/2019  Discharge disposition: Home   Recommendations for Outpatient Follow-Up:   Outpatient follow up with PCP in 1 week   Discharge Diagnosis:   Principal Problem:   Acute asthma exacerbation Active Problems:   Asthma, chronic, unspecified asthma severity, with acute exacerbation   Morbid obesity ([place])   Obstructive sleep apnea   Acute bronchitis   Gram-positive cocci bacteremia   Type 2 diabetes mellitus without complication ([place])   Bandemia    Discharge Condition: Stable.  Diet recommendation: low salt and diabetic diet  Code status: Full code.    Hospital Course:   [name] is a 39 year old man with medical history significant for asthma, morbid obesity, type 2 diabetes mellitus who was admitted to the hospital initially, on 03/23/2019 for acute asthma exacerbation and acute hypoxemic respiratory failure.  He attributed his asthma attack to exposure to dust while cleaning a new car he had bought recently.  Unfortunately, he signed out [place] because of work commitments.  However, patient was called back to the hospital because of gram-positive cocci bacteremia.  He was readmitted to the hospital on 03/24/2019.  He was treated with steroids and bronchodilators.  There was concern for sepsis because of gram-positive cocci bacteremia and significant leukocytosis.  He was therefore started on empiric IV antibiotics.  1 out of 2 blood culture bottles grew coagulase-negative staph which was thought to be a contaminant.  It was also discovered that patient has chronic leukocytosis of unclear etiology.  Antibiotics were therefore discontinued because there was no clear evidence of infection.  Unfortunately, patient developed severe hyperglycemia which was likely exacerbated by steroid use  for treatment of asthma exacerbation.  Hemoglobin A1c was 8.  Patient was medically nonadherent with positive lifestyle changes.  He was treated with insulin.  He was educated on insulin management diabetes mellitus and hypoglycemia.  She was also seen by dietitian for counseling on healthy eating habits.  She received counseling from the diabetic educator as well.  His condition has improved and is deemed stable for discharge to home.  Metformin and glipizide prescribed for diabetes and lisinopril was prescribed for hypertension.  Risks, benefits and alternatives to these medications were discussed.  He has been advised to follow-up with PCP for routine health maintenance and for management of hypertension and diabetes.  He has also been advised to have a PCP make arrangements for outpatient sleep study for evaluation of obstructive sleep apnea.      Discharge Exam:   Vitals:   03/26/19 0751 03/26/19 0851  BP:  (!) 145/102  Pulse:  90  Resp:  18  Temp:  98.4 F (36.9 C)  SpO2: 96% 92%   Vitals:   03/26/19 0011 03/26/19 0214 03/26/19 0751 03/26/19 0851  BP: (!) 139/101   (!) 145/102  Pulse: 77   90  Resp: 17   18  Temp:    98.4 F (36.9 C)  TempSrc:    Oral  SpO2: 95% 99% 96% 92%  Weight:      Height:         GEN: NAD, morbidly obese SKIN: No rash EYES: EOMI ENT: MMM CV: RRR PULM: CTA B ABD: soft, obese, NT, +BS CNS: AAO x 3, non focal EXT: No edema or tenderness   The results of significant diagnostics from this hospitalization (including imaging, microbiology, ancillary  and laboratory) are listed below for reference.     Procedures and Diagnostic Studies:   DG Chest 2 View  Result Date: 03/24/2019 CLINICAL DATA:  Cough, wheezing, positive blood cultures EXAM: CHEST - 2 VIEW COMPARISON:  Radiograph 03/22/2019 FINDINGS: Minimal airways thickening could reflect reactive airways disease or bronchitis. No consolidation, features of edema, pneumothorax, or effusion.  More readily apparent on today's examination is some increased attenuation at the level of the upper mediastinum with questionable retrosternal thickening on the lateral radiograph. IMPRESSION: Minimal airways thickening could reflect reactive airways disease or bronchitis. No consolidation. Question some increased attenuation at the level of manubrium with retrosternal density on lateral radiograph. In the setting of bacteremia, cannot exclude possible osteomyelitis. Consider further evaluation with CT. These results were called by telephone at the time of interpretation on 03/24/2019 at 12:16 am to provider [facility] , who verbally acknowledged these results. Electronically Signed   By: Lovena Le M.D.   On: 03/24/2019 00:16   CT Chest W Contrast  Result Date: 03/24/2019 CLINICAL DATA:  Bacteremia, radiographic abnormality with possible retrosternal thickening EXAM: CT CHEST WITH CONTRAST TECHNIQUE: Multidetector CT imaging of the chest was performed during intravenous contrast administration. CONTRAST:  69m OMNIPAQUE IOHEXOL 300 MG/ML  SOLN COMPARISON:  Same-day radiograph FINDINGS: Cardiovascular: Normal heart size. No pericardial effusion. Normal caliber thoracic aorta with normal 3 vessel branching of the aortic arch. Proximal great vessels are unremarkable. Central pulmonary arteries are normal caliber. Exam is not tailored for the luminal evaluation of the pulmonary arteries. Mediastinum/Nodes: No mediastinal fluid or gas. Normal thyroid gland and thoracic inlet. No acute abnormality of the trachea or esophagus. No worrisome mediastinal, hilar or axillary adenopathy. Lungs/Pleura: Mild thickening of the basilar airways may reflect bronchitic change or reactive airways disease. No consolidation, features of edema, pneumothorax, or effusion. No suspicious pulmonary nodules or masses. Upper Abdomen: No acute abnormalities present in the visualized portions of the upper abdomen. Musculoskeletal: Area  of radiographic abnormality appears to correspond the sternoclavicular joints and first rib cartilages without abnormal erosive or destructive changes or adjacent swelling or inflammation to suggest active inflammation/infection at this location. Normal bone mineralization. Remaining osseous structures are unremarkable with minimal degenerative change in the spine. IMPRESSION: Area of radiographic abnormality appears to correspond to slightly protuberant first costal cartilages and the overlap of the adjacent sternal costal joints. Appearance is symmetric without features of erosion or destructive change or surrounding phlegmon/inflammation to suggest infection. Mild basilar airways thickening, could reflect a mild bronchitis or reactive airways disease. No other acute intrathoracic abnormality is seen. Electronically Signed   By: PLovena LeM.D.   On: 03/24/2019 01:36     Labs:   Basic Metabolic Panel: Recent Labs  Lab 03/22/19 2130 03/22/19 2130 03/24/19 0149 03/24/19 0149 03/25/19 0556 03/26/19 0540  NA 139  --  137  --  135 138  K 4.7   < > 4.3   < > 4.8 3.9  CL 101  --  102  --  101 102  CO2 26  --  26  --  24 26  GLUCOSE 147*  --  228*  --  388* 222*  BUN 18  --  26*  --  24* 25*  CREATININE 1.04  --  1.14  --  1.01 1.02  CALCIUM 9.5  --  8.9  --  9.1 9.1   < > = values in this interval not displayed.   GFR Estimated Creatinine Clearance: 129.4 mL/min (by  C-G formula based on SCr of 1.02 mg/dL). Liver Function Tests: Recent Labs  Lab 03/22/19 2130 03/24/19 0149  AST 39 31  ALT 38 32  ALKPHOS 53 45  BILITOT 0.9 0.7  PROT 8.7* 7.9  ALBUMIN 4.8 4.1   No results for input(s): LIPASE, AMYLASE in the last 168 hours. No results for input(s): AMMONIA in the last 168 hours. Coagulation profile Recent Labs  Lab 03/24/19 0149  INR 1.1    CBC: Recent Labs  Lab 03/22/19 2130 03/24/19 0149 03/25/19 0556 03/26/19 0540  WBC 20.3* 21.4* 19.1* 19.5*  NEUTROABS 15.3*  15.0* 15.0* 10.0*  HGB 14.9 13.4 13.5 14.0  HCT 42.2 38.2* 38.7* 40.6  MCV 75.0* 75.3* 75.3* 76.2*  PLT 319 312 326 345   Cardiac Enzymes: No results for input(s): CKTOTAL, CKMB, CKMBINDEX, TROPONINI in the last 168 hours. BNP: Invalid input(s): POCBNP CBG: Recent Labs  Lab 03/25/19 1150 03/25/19 1721 03/25/19 2200 03/26/19 0847  GLUCAP 320* 337* 284* 191*   D-Dimer No results for input(s): DDIMER in the last 72 hours. Hgb A1c Recent Labs    03/24/19 0545  HGBA1C 8.0*   Lipid Profile No results for input(s): CHOL, HDL, LDLCALC, TRIG, CHOLHDL, LDLDIRECT in the last 72 hours. Thyroid function studies No results for input(s): TSH, T4TOTAL, T3FREE, THYROIDAB in the last 72 hours.  Invalid input(s): FREET3 Anemia work up No results for input(s): VITAMINB12, FOLATE, FERRITIN, TIBC, IRON, RETICCTPCT in the last 72 hours. Microbiology Recent Results (from the past 240 hour(s))  Blood Culture (routine x 2)     Status: None (Preliminary result)   Collection Time: 03/22/19 11:33 PM   Specimen: BLOOD  Result Value Ref Range Status   Specimen Description BLOOD BLOOD RIGHT HAND  Final   Special Requests   Final    BOTTLES DRAWN AEROBIC AND ANAEROBIC Blood Culture adequate volume   Culture   Final    NO GROWTH 4 DAYS Performed at [facility], [address] [place], [place] [address]    Report Status PENDING  Incomplete  Blood Culture (routine x 2)     Status: Abnormal   Collection Time: 03/22/19 11:33 PM   Specimen: BLOOD  Result Value Ref Range Status   Specimen Description   Final    BLOOD BLOOD LEFT HAND Performed at [facility], [address] [place], [place] [address]    Special Requests   Final    BOTTLES DRAWN AEROBIC AND ANAEROBIC Blood Culture adequate volume Performed at [facility], [address] [place], [place] [address]    Culture  Setup Time   Final    GRAM POSITIVE COCCI AEROBIC BOTTLE ONLY CRITICAL RESULT CALLED  TO, READ BACK BY AND VERIFIED WITH: BRANDY DAVIS AT 3668 03/23/19.PMF Performed at [facility], [address] [place], [place] [address]    Culture (A)  Final    STAPHYLOCOCCUS SPECIES (COAGULASE NEGATIVE) THE SIGNIFICANCE OF ISOLATING THIS ORGANISM FROM A SINGLE SET OF BLOOD CULTURES WHEN MULTIPLE SETS ARE DRAWN IS UNCERTAIN. PLEASE NOTIFY THE MICROBIOLOGY DEPARTMENT WITHIN ONE WEEK IF SPECIATION AND SENSITIVITIES ARE REQUIRED. Performed at [facility], [place] [address] [place], [place] [address]    Report Status 03/25/2019 FINAL  Final  Blood Culture (routine x 2)     Status: None (Preliminary result)   Collection Time: 03/24/19 12:14 AM   Specimen: BLOOD  Result Value Ref Range Status   Specimen Description BLOOD BLOOD LEFT HAND  Final  Special Requests   Final    BOTTLES DRAWN AEROBIC AND ANAEROBIC Blood Culture adequate volume   Culture   Final    NO GROWTH 2 DAYS Performed at [facility], [address] [place], [place] [address]    Report Status PENDING  Incomplete  Blood Culture (routine x 2)     Status: None (Preliminary result)   Collection Time: 03/24/19 12:15 AM   Specimen: BLOOD  Result Value Ref Range Status   Specimen Description BLOOD RIGHT ANTECUBITAL  Final   Special Requests   Final    BOTTLES DRAWN AEROBIC AND ANAEROBIC Blood Culture adequate volume   Culture   Final    NO GROWTH 2 DAYS Performed at [facility], [address], [place], [place] [address]    Report Status PENDING  Incomplete     Discharge Instructions:   Discharge Instructions    Diet - low sodium heart healthy   Complete by: As directed    Diet Carb Modified   Complete by: As directed    Discharge instructions   Complete by: As directed    Weight loss and regular exercise recommended. Check your blood sugar once a day, preferably in the morning before meals. Call your doctor for advise if blood sugar is persistently below 70 or above 250. See  information on symptoms of low and high blood sugar attached to discharge instructions   Increase activity slowly   Complete by: As directed      Allergies as of 03/26/2019      Reactions   Shellfish Allergy Swelling      Medication List    STOP taking these medications   levofloxacin 750 MG tablet Commonly known as: Levaquin     TAKE these medications   albuterol 108 (90 Base) MCG/ACT inhaler Commonly known as: VENTOLIN HFA Inhale 2 puffs into the lungs every 6 (six) hours as needed for wheezing or shortness of breath.   blood glucose meter kit and supplies Dispense based on patient and insurance preference. Use up to four times daily as directed. (FOR ICD-10 E10.9, E11.9).   glipiZIDE 5 MG 24 hr tablet Commonly known as: GLUCOTROL XL Take 1 tablet (5 mg total) by mouth daily with breakfast.   lisinopril 10 MG tablet Commonly known as: ZESTRIL Take 1 tablet (10 mg total) by mouth daily.   metFORMIN 500 MG tablet Commonly known as: Glucophage Take 1 tablet (500 mg total) by mouth 2 (two) times daily with a meal.   montelukast 10 MG tablet Commonly known as: Singulair Take 1 tablet (10 mg total) by mouth at bedtime.   predniSONE 20 MG tablet Commonly known as: Deltasone Take 2 tablets (40 mg total) by mouth daily for 2 days. Start taking on: March 27, 2019         Time coordinating discharge: 28 minutes  Signed:  Jennye Boroughs  [facility] 03/26/2019, 9:34 AM

## 2019-03-26 NOTE — Progress Notes (Signed)
Discharge instructions reviewed with patient. He verbalized understanding. IV removed. Stable condition. Prescription given and med details reviewed with him. He left on foot, did not want to be wheeled out by NT.

## 2019-03-27 LAB — CULTURE, BLOOD (ROUTINE X 2)
Culture: NO GROWTH
Special Requests: ADEQUATE

## 2019-03-29 LAB — CULTURE, BLOOD (ROUTINE X 2)
Culture: NO GROWTH
Culture: NO GROWTH
Special Requests: ADEQUATE
Special Requests: ADEQUATE

## 2019-06-20 ENCOUNTER — Telehealth: Payer: Self-pay | Admitting: General Practice

## 2019-06-20 NOTE — Telephone Encounter (Signed)
Was mailed an application 03/27/2019. Was contacted 3 times regarding how to become a pt in the clinic.

## 2019-07-02 ENCOUNTER — Telehealth: Payer: Self-pay | Admitting: General Practice

## 2019-07-02 NOTE — Telephone Encounter (Signed)
Individual has been contacted 3+ times regarding ED referral and has been given information regarding how to become a pt. No further attempts to contact individual will be made. 

## 2019-09-16 ENCOUNTER — Emergency Department: Payer: Self-pay

## 2019-09-16 ENCOUNTER — Emergency Department
Admission: EM | Admit: 2019-09-16 | Discharge: 2019-09-16 | Disposition: A | Discharge status: Home / Self Care | Payer: Self-pay | Attending: Emergency Medicine | Admitting: Emergency Medicine

## 2019-09-16 ENCOUNTER — Encounter: Payer: Self-pay | Admitting: Radiology

## 2019-09-16 ENCOUNTER — Other Ambulatory Visit: Payer: Self-pay

## 2019-09-16 DIAGNOSIS — R0602 Shortness of breath: Secondary | ICD-10-CM | POA: Insufficient documentation

## 2019-09-16 DIAGNOSIS — E119 Type 2 diabetes mellitus without complications: Secondary | ICD-10-CM | POA: Insufficient documentation

## 2019-09-16 DIAGNOSIS — Z7951 Long term (current) use of inhaled steroids: Secondary | ICD-10-CM | POA: Insufficient documentation

## 2019-09-16 DIAGNOSIS — F17210 Nicotine dependence, cigarettes, uncomplicated: Secondary | ICD-10-CM | POA: Insufficient documentation

## 2019-09-16 DIAGNOSIS — R0789 Other chest pain: Secondary | ICD-10-CM | POA: Insufficient documentation

## 2019-09-16 DIAGNOSIS — J45901 Unspecified asthma with (acute) exacerbation: Secondary | ICD-10-CM | POA: Insufficient documentation

## 2019-09-16 DIAGNOSIS — Z79899 Other long term (current) drug therapy: Secondary | ICD-10-CM | POA: Insufficient documentation

## 2019-09-16 LAB — CBC
HCT: 37.9 % — ABNORMAL LOW (ref 39.0–52.0)
Hemoglobin: 13.4 g/dL (ref 13.0–17.0)
MCH: 26.5 pg (ref 26.0–34.0)
MCHC: 35.4 g/dL (ref 30.0–36.0)
MCV: 75 fL — ABNORMAL LOW (ref 80.0–100.0)
Platelets: 329 10*3/uL (ref 150–400)
RBC: 5.05 MIL/uL (ref 4.22–5.81)
RDW: 14.2 % (ref 11.5–15.5)
WBC: 14.2 10*3/uL — ABNORMAL HIGH (ref 4.0–10.5)
nRBC: 0 % (ref 0.0–0.2)

## 2019-09-16 LAB — FIBRIN DERIVATIVES D-DIMER (ARMC ONLY): Fibrin derivatives D-dimer (ARMC): 294.96 ng/mL (FEU) (ref 0.00–499.00)

## 2019-09-16 LAB — TROPONIN I (HIGH SENSITIVITY)
Troponin I (High Sensitivity): 4 ng/L (ref <18)
Troponin I (High Sensitivity): 3 ng/L (ref <18)

## 2019-09-16 LAB — BASIC METABOLIC PANEL
Anion gap: 12 (ref 5–15)
BUN: 25 mg/dL — ABNORMAL HIGH (ref 6–20)
CO2: 26 mmol/L (ref 22–32)
Calcium: 9.1 mg/dL (ref 8.9–10.3)
Chloride: 101 mmol/L (ref 98–111)
Creatinine, Ser: 0.8 mg/dL (ref 0.61–1.24)
GFR calc Af Amer: >60 mL/min (ref >60)
GFR calc non Af Amer: >60 mL/min (ref >60)
Glucose, Bld: 200 mg/dL — ABNORMAL HIGH (ref 70–99)
Potassium: 4.1 mmol/L (ref 3.5–5.1)
Sodium: 139 mmol/L (ref 135–145)

## 2019-09-16 MED ORDER — LIDOCAINE 5 % EX PTCH: 1.0000 | MEDICATED_PATCH | Freq: Two times a day (BID) | CUTANEOUS | 0 refills | Status: AC

## 2019-09-16 MED ORDER — KETOROLAC TROMETHAMINE 30 MG/ML IJ SOLN
15.0000 mg | Freq: Once | INTRAMUSCULAR | Status: AC
  Administered 2019-09-16: 15 mg via INTRAVENOUS
  Filled 2019-09-16: qty 1

## 2019-09-16 MED ORDER — LIDOCAINE 5 % EX PTCH
1.0000 | MEDICATED_PATCH | CUTANEOUS | Status: DC
  Filled 2019-09-16: qty 1

## 2019-09-16 NOTE — ED Triage Notes (Signed)
Pt states left sided rib pain for "I don't know how long" and shob over four days. Pt appears in no acute distress, states pain is worse with movement and deep inspiration.

## 2019-09-16 NOTE — ED Provider Notes (Signed)
[facility] Emergency Department Provider Note   ____________________________________________   First MD Initiated Contact with Patient 09/16/19 [phone]     (approximate)  I have reviewed the triage vital signs and the nursing notes.   HISTORY  Chief Complaint Chest Pain and Shortness of Breath    HPI Ryan Beck is a 39 y.o. male with possible history of asthma and diabetes who presents the ED complaining of chest pain.  Patient reports that he has had 3 to 4 days of sharp pain along the left chest wall.  He describes the pain as constant and exacerbated when he goes to take a deep breath, occasionally making it hard for him to breathe.  He denies any trauma to this area and has not noticed any rashes.  He has not noticed any pain or swelling in his legs and denies any history of DVT/PE.  He states he has been coughing occasionally, but no worse than usual and his cough is nonproductive.  He denies any fevers.        Past Medical History:  Diagnosis Date  . Asthma     Patient Active Problem List   Diagnosis Date Noted  . Bandemia 03/25/2019  . Acute bronchitis 03/24/2019  . Gram-positive cocci bacteremia 03/24/2019  . Type 2 diabetes mellitus without complication ([place]) 22/97/9892  . Asthma, chronic, unspecified asthma severity, with acute exacerbation 03/22/2019  . Hypoxia 03/22/2019  . Morbid obesity ([place]) 03/22/2019  . Elevated hemoglobin A1c 03/22/2019  . Elevated blood pressure reading 03/22/2019  . Obstructive sleep apnea 03/22/2019  . Leukocytosis 03/22/2019  . Acute asthma exacerbation 03/22/2019    Past Surgical History:  Procedure Laterality Date  . NO PAST SURGERIES      Prior to Admission medications   Medication Sig Start Date End Date Taking? Authorizing Provider  albuterol (VENTOLIN HFA) 108 (90 Base) MCG/ACT inhaler Inhale 2 puffs into the lungs every 6 (six) hours as needed for wheezing or shortness of breath. 03/23/19    Priscella Mann, Trula Slade, MD  blood glucose meter kit and supplies Dispense based on patient and insurance preference. Use up to four times daily as directed. (FOR ICD-10 E10.9, E11.9). 03/26/19   Jennye Boroughs, MD  glipiZIDE (GLUCOTROL XL) 5 MG 24 hr tablet Take 1 tablet (5 mg total) by mouth daily with breakfast. 03/26/19   Jennye Boroughs, MD  lidocaine (LIDODERM) 5 % Place 1 patch onto the skin every 12 (twelve) hours for 5 days. Remove & Discard patch within 12 hours or as directed by MD 09/16/19 09/21/19  Blake Divine, MD  lisinopril (ZESTRIL) 10 MG tablet Take 1 tablet (10 mg total) by mouth daily. 03/26/19   Jennye Boroughs, MD  metFORMIN (GLUCOPHAGE) 500 MG tablet Take 1 tablet (500 mg total) by mouth 2 (two) times daily with a meal. 03/26/19 03/25/20  Jennye Boroughs, MD  montelukast (SINGULAIR) 10 MG tablet Take 1 tablet (10 mg total) by mouth at bedtime. 03/23/19 06/21/19  Sidney Ace, MD    Allergies Shellfish allergy  Family History  Problem Relation Age of Onset  . Hypertension Other        The patient reports that almost all family members have high blood pressure    Social History Social History   Tobacco Use  . Smoking status: Current Every Day Smoker    Packs/day: 0.50    Types: Cigarettes  . Smokeless tobacco: Never Used  Substance Use Topics  . Alcohol use: No  . Drug  use: No    Review of Systems  Constitutional: No fever/chills Eyes: No visual changes. ENT: No sore throat. Cardiovascular: Positive for chest pain. Respiratory: Positive for shortness of breath. Gastrointestinal: No abdominal pain.  No nausea, no vomiting.  No diarrhea.  No constipation. Genitourinary: Negative for dysuria. Musculoskeletal: Negative for back pain. Skin: Negative for rash. Neurological: Negative for headaches, focal weakness or numbness.  ____________________________________________   PHYSICAL EXAM:  VITAL SIGNS: ED Triage Vitals  Enc Vitals Group     BP 09/16/19 0100  128/67     Pulse Rate 09/16/19 0100 81     Resp 09/16/19 0100 20     Temp 09/16/19 0100 98.2 F (36.8 C)     Temp Source 09/16/19 0100 Oral     SpO2 09/16/19 0100 96 %     Weight 09/16/19 0103 (!) 310 lb (140.6 kg)     Height 09/16/19 0103 '5\' 7"'  (1.702 m)     Head Circumference --      Peak Flow --      Pain Score 09/16/19 0103 8     Pain Loc --      Pain Edu? --      Excl. in [place]? --     Constitutional: Alert and oriented. Eyes: Conjunctivae are normal. Head: Atraumatic. Nose: No congestion/rhinnorhea. Mouth/Throat: Mucous membranes are moist. Neck: Normal ROM Cardiovascular: Normal rate, regular rhythm. Grossly normal heart sounds. Respiratory: Normal respiratory effort.  No retractions. Lungs CTAB.  Left chest wall tenderness to palpation. Gastrointestinal: Soft and nontender. No distention. Genitourinary: deferred Musculoskeletal: No lower extremity tenderness nor edema. Neurologic:  Normal speech and language. No gross focal neurologic deficits are appreciated. Skin:  Skin is warm, dry and intact. No rash noted. Psychiatric: Mood and affect are normal. Speech and behavior are normal.  ____________________________________________   LABS (all labs ordered are listed, but only abnormal results are displayed)  Labs Reviewed  CBC - Abnormal; Notable for the following components:      Result Value   WBC 14.2 (*)    HCT 37.9 (*)    MCV 75.0 (*)    All other components within normal limits  BASIC METABOLIC PANEL - Abnormal; Notable for the following components:   Glucose, Bld 200 (*)    BUN 25 (*)    All other components within normal limits  FIBRIN DERIVATIVES D-DIMER ([facility] ONLY)  TROPONIN I (HIGH SENSITIVITY)  TROPONIN I (HIGH SENSITIVITY)   ____________________________________________  EKG  ED ECG REPORT I, Blake Divine, the attending physician, personally viewed and interpreted this ECG.   Date: 09/16/2019  EKG Time: 00:53  Rate: 76  Rhythm: normal  sinus rhythm  Axis: LAD  Intervals:left anterior fascicular block  ST&T Change: None   PROCEDURES  Procedure(s) performed (including Critical Care):  Procedures   ____________________________________________   INITIAL IMPRESSION / ASSESSMENT AND PLAN / ED COURSE       39 year old male with past medical history of asthma and diabetes presents to the ED complaining of left chest wall pain that is sharp and exacerbated when he coughs or goes to take a deep breath.  Pain is reproducible with palpation along the left side but there is no rash to suggest shingles.  Chest x-ray is negative for acute process, EKG and 2 sets of troponin are unremarkable.  I have very low suspicion for ACS but we will further assess for PE with D-dimer.  If this is negative, then PE may be ruled out as he is  low risk by [place].  We will treat patient's pain with Lidoderm patch and IV Toradol.  Patient reports slight improvement in pain following dose of Toradol, D-dimer is within normal limits and I doubt PE.  He is appropriate for discharge home as his symptoms are likely due to pleurisy versus muscle strain.  He was counseled to try Lidoderm patches as well as Tylenol/ibuprofen for pain and to follow-up with his PCP.  Patient agrees with plan.      ____________________________________________   FINAL CLINICAL IMPRESSION(S) / ED DIAGNOSES  Final diagnoses:  Chest wall pain     ED Discharge Orders         Ordered    lidocaine (LIDODERM) 5 %  Every 12 hours        09/16/19 1038           Note:  This document was prepared using Dragon voice recognition software and may include unintentional dictation errors.   Blake Divine, MD 09/16/19 1039

## 2020-02-25 ENCOUNTER — Other Ambulatory Visit: Payer: Self-pay

## 2020-02-25 DIAGNOSIS — E119 Type 2 diabetes mellitus without complications: Secondary | ICD-10-CM | POA: Insufficient documentation

## 2020-02-25 DIAGNOSIS — Z7984 Long term (current) use of oral hypoglycemic drugs: Secondary | ICD-10-CM | POA: Insufficient documentation

## 2020-02-25 DIAGNOSIS — L02416 Cutaneous abscess of left lower limb: Secondary | ICD-10-CM | POA: Insufficient documentation

## 2020-02-25 DIAGNOSIS — L739 Follicular disorder, unspecified: Secondary | ICD-10-CM | POA: Insufficient documentation

## 2020-02-25 DIAGNOSIS — J45909 Unspecified asthma, uncomplicated: Secondary | ICD-10-CM | POA: Insufficient documentation

## 2020-02-25 DIAGNOSIS — F17210 Nicotine dependence, cigarettes, uncomplicated: Secondary | ICD-10-CM | POA: Insufficient documentation

## 2020-02-25 DIAGNOSIS — Z79899 Other long term (current) drug therapy: Secondary | ICD-10-CM | POA: Insufficient documentation

## 2020-02-25 NOTE — ED Triage Notes (Signed)
Pt states he 2 days ago he noticed what he calls a boil to his left upper thigh. Pt states he has been trying to drain pus from inside area but it has just gotten worse. Pt c/o pain at site as well.

## 2020-02-26 ENCOUNTER — Emergency Department
Admission: EM | Admit: 2020-02-26 | Discharge: 2020-02-26 | Disposition: A | Discharge status: Home / Self Care | Payer: Self-pay | Attending: Emergency Medicine | Admitting: Emergency Medicine

## 2020-02-26 DIAGNOSIS — L02416 Cutaneous abscess of left lower limb: Secondary | ICD-10-CM

## 2020-02-26 DIAGNOSIS — L739 Follicular disorder, unspecified: Secondary | ICD-10-CM

## 2020-02-26 MED ORDER — DOXYCYCLINE HYCLATE 50 MG PO CAPS: 100.0000 mg | ORAL_CAPSULE | Freq: Two times a day (BID) | ORAL | 0 refills | Status: AC

## 2020-02-26 NOTE — ED Provider Notes (Signed)
[facility] Emergency Department Provider Note   ____________________________________________   Event Date/Time   First MD Initiated Contact with Patient 02/26/20 [phone]     (approximate)  I have reviewed the triage vital signs and the nursing notes.   HISTORY  Chief Complaint Abscess    HPI Ryan Beck is a 40 y.o. male with a stated past medical history of asthma who presents for a boil to the posterior aspect of his left thigh that has been there for the last 2 days and has just ruptured today with purulent drainage.  Patient describes 8/10 throbbing posterior thigh pain that does not radiate and is worse with sitting or movement.  Patient currently denies any vision changes, tinnitus, difficulty speaking, facial droop, sore throat, chest pain, shortness of breath, abdominal pain, nausea/vomiting/diarrhea, dysuria, or weakness/numbness/paresthesias in any extremity         Past Medical History:  Diagnosis Date  . Asthma     Patient Active Problem List   Diagnosis Date Noted  . Bandemia 03/25/2019  . Acute bronchitis 03/24/2019  . Gram-positive cocci bacteremia 03/24/2019  . Type 2 diabetes mellitus without complication ([place]) 11/91/4782  . Asthma, chronic, unspecified asthma severity, with acute exacerbation 03/22/2019  . Hypoxia 03/22/2019  . Morbid obesity ([place]) 03/22/2019  . Elevated hemoglobin A1c 03/22/2019  . Elevated blood pressure reading 03/22/2019  . Obstructive sleep apnea 03/22/2019  . Leukocytosis 03/22/2019  . Acute asthma exacerbation 03/22/2019    Past Surgical History:  Procedure Laterality Date  . NO PAST SURGERIES      Prior to Admission medications   Medication Sig Start Date End Date Taking? Authorizing Provider  doxycycline (VIBRAMYCIN) 50 MG capsule Take 2 capsules (100 mg total) by mouth 2 (two) times daily for 5 days. 02/26/20 03/02/20 Yes Naaman Plummer, MD  albuterol (VENTOLIN HFA) 108 (90 Base) MCG/ACT  inhaler Inhale 2 puffs into the lungs every 6 (six) hours as needed for wheezing or shortness of breath. 03/23/19   Sidney Ace, MD  blood glucose meter kit and supplies Dispense based on patient and insurance preference. Use up to four times daily as directed. (FOR ICD-10 E10.9, E11.9). 03/26/19   Jennye Boroughs, MD  glipiZIDE (GLUCOTROL XL) 5 MG 24 hr tablet Take 1 tablet (5 mg total) by mouth daily with breakfast. 03/26/19   Jennye Boroughs, MD  lisinopril (ZESTRIL) 10 MG tablet Take 1 tablet (10 mg total) by mouth daily. 03/26/19   Jennye Boroughs, MD  metFORMIN (GLUCOPHAGE) 500 MG tablet Take 1 tablet (500 mg total) by mouth 2 (two) times daily with a meal. 03/26/19 03/25/20  Jennye Boroughs, MD  montelukast (SINGULAIR) 10 MG tablet Take 1 tablet (10 mg total) by mouth at bedtime. 03/23/19 06/21/19  Sidney Ace, MD    Allergies Shellfish allergy  Family History  Problem Relation Age of Onset  . Hypertension Other        The patient reports that almost all family members have high blood pressure    Social History Social History   Tobacco Use  . Smoking status: Current Every Day Smoker    Packs/day: 0.50    Types: Cigarettes  . Smokeless tobacco: Never Used  Substance Use Topics  . Alcohol use: No  . Drug use: No    Review of Systems Constitutional: No fever/chills Eyes: No visual changes. ENT: No sore throat. Cardiovascular: Denies chest pain. Respiratory: Denies shortness of breath. Gastrointestinal: No abdominal pain.  No nausea, no vomiting.  No diarrhea. Genitourinary: Negative for dysuria. Musculoskeletal: Negative for acute arthralgias Skin: Endorses left posterior thigh abscess Neurological: Negative for headaches, weakness/numbness/paresthesias in any extremity Psychiatric: Negative for suicidal ideation/homicidal ideation   ____________________________________________   PHYSICAL EXAM:  VITAL SIGNS: ED Triage Vitals  Enc Vitals Group     BP  02/25/20 2353 126/71     Pulse Rate 02/25/20 2353 80     Resp 02/25/20 2353 18     Temp 02/25/20 2353 97.8 F (36.6 C)     Temp src --      SpO2 02/25/20 2353 92 %     Weight 02/25/20 2351 (!) 310 lb (140.6 kg)     Height 02/25/20 2351 _0  (1.676 m)     Head Circumference --      Peak Flow --      Pain Score 02/25/20 2351 8     Pain Loc --      Pain Edu? --      Excl. in [place]? --    Constitutional: Alert and oriented. Well appearing and in no acute distress. Eyes: Conjunctivae are normal. PERRL. Head: Atraumatic. Nose: No congestion/rhinnorhea. Mouth/Throat: Mucous membranes are moist. Neck: No stridor Cardiovascular: Grossly normal heart sounds.  Good peripheral circulation. Respiratory: Normal respiratory effort.  No retractions. Gastrointestinal: Soft and nontender. No distention. Musculoskeletal: No obvious deformities Neurologic:  Normal speech and language. No gross focal neurologic deficits are appreciated. Skin:  Skin is warm and dry.  1 cm diameter area of induration with central punctate area of purulent drainage Psychiatric: Mood and affect are normal. Speech and behavior are normal.  ____________________________________________   LABS (all labs ordered are listed, but only abnormal results are displayed)  Labs Reviewed - No data to display  PROCEDURES  Procedure(s) performed (including Critical Care):  .1-3 Lead EKG Interpretation Performed by: Naaman Plummer, MD Authorized by: Naaman Plummer, MD     Interpretation: normal     ECG rate:  89   ECG rate assessment: normal     Rhythm: sinus rhythm     Ectopy: none     Conduction: normal       ____________________________________________   INITIAL IMPRESSION / ASSESSMENT AND PLAN / ED COURSE  As part of my medical decision making, I reviewed the following data within the [place] notes reviewed and incorporated, Labs reviewed, EKG interpreted, Old chart reviewed,  Radiograph reviewed and Notes from prior ED visits reviewed and incorporated        Presentation most consistent with simple Abscess. Given History, Exam, and Workup I have low suspicion for Cellulitis, Necrotizing Fasciitis, Pyomyositis, Sporotrichosis, Osteomyelitis or other emergent problem as a cause for this presentation. Interventions: none necessary as wound is already open and draining Rx: Doxycycline 100 mg BID x 5 days Disposition: Patient is stable for discharge, advised to follow up with primary care physician in 48 hours.      ____________________________________________   FINAL CLINICAL IMPRESSION(S) / ED DIAGNOSES  Final diagnoses:  Abscess of left thigh  Folliculitis     ED Discharge Orders         Ordered    doxycycline (VIBRAMYCIN) 50 MG capsule  2 times daily        02/26/20 0251           Note:  This document was prepared using Dragon voice recognition software and may include unintentional dictation errors.   Naaman Plummer, MD 02/26/20 [phone]

## 2020-04-07 ENCOUNTER — Emergency Department
Admission: EM | Admit: 2020-04-07 | Discharge: 2020-04-07 | Disposition: A | Discharge status: Home / Self Care | Payer: Self-pay | Attending: Emergency Medicine | Admitting: Emergency Medicine

## 2020-04-07 ENCOUNTER — Other Ambulatory Visit: Payer: Self-pay

## 2020-04-07 DIAGNOSIS — Z7984 Long term (current) use of oral hypoglycemic drugs: Secondary | ICD-10-CM | POA: Insufficient documentation

## 2020-04-07 DIAGNOSIS — T7840XA Allergy, unspecified, initial encounter: Secondary | ICD-10-CM

## 2020-04-07 DIAGNOSIS — F17210 Nicotine dependence, cigarettes, uncomplicated: Secondary | ICD-10-CM | POA: Insufficient documentation

## 2020-04-07 DIAGNOSIS — J029 Acute pharyngitis, unspecified: Secondary | ICD-10-CM

## 2020-04-07 DIAGNOSIS — E119 Type 2 diabetes mellitus without complications: Secondary | ICD-10-CM | POA: Insufficient documentation

## 2020-04-07 DIAGNOSIS — J45901 Unspecified asthma with (acute) exacerbation: Secondary | ICD-10-CM | POA: Insufficient documentation

## 2020-04-07 DIAGNOSIS — H00015 Hordeolum externum left lower eyelid: Secondary | ICD-10-CM

## 2020-04-07 LAB — GROUP A STREP BY PCR: Group A Strep by PCR: NOT DETECTED

## 2020-04-07 MED ORDER — ERYTHROMYCIN 5 MG/GM OP OINT: 1.0000 "application " | TOPICAL_OINTMENT | Freq: Three times a day (TID) | OPHTHALMIC | 0 refills | Status: AC

## 2020-04-07 NOTE — ED Triage Notes (Signed)
Pt comes wth c/o allergic reaction. Pt states he woke up this am and noticed his left eye was red and swollen. Pt states his throat hurts as well.  Pt unsure of any new allergies.

## 2020-04-07 NOTE — ED Notes (Signed)
See triage note  Presents with left eye swelling and sore throat this am     States his eye was irritated this am   No fever

## 2020-04-07 NOTE — ED Provider Notes (Signed)
[facility] Emergency Department Provider Note  ____________________________________________   Event Date/Time   First MD Initiated Contact with Patient 04/07/20 1531     (approximate)  I have reviewed the triage vital signs and the nursing notes.   HISTORY  Chief Complaint Allergic Reaction    HPI Rafay Dahan is a 40 y.o. male presents emergency department complaint of left eye swelling and sore throat starts morning.  States his eye feels irritated this morning.  He thinks it is an allergic reaction but did not change anything in his diet or personal use over the past few days.  No fever or chills.  No difficulty breathing.    Past Medical History:  Diagnosis Date  . Asthma     Patient Active Problem List   Diagnosis Date Noted  . Bandemia 03/25/2019  . Acute bronchitis 03/24/2019  . Gram-positive cocci bacteremia 03/24/2019  . Type 2 diabetes mellitus without complication ([place]) 40/98/1191  . Asthma, chronic, unspecified asthma severity, with acute exacerbation 03/22/2019  . Hypoxia 03/22/2019  . Morbid obesity ([place]) 03/22/2019  . Elevated hemoglobin A1c 03/22/2019  . Elevated blood pressure reading 03/22/2019  . Obstructive sleep apnea 03/22/2019  . Leukocytosis 03/22/2019  . Acute asthma exacerbation 03/22/2019    Past Surgical History:  Procedure Laterality Date  . NO PAST SURGERIES      Prior to Admission medications   Medication Sig Start Date End Date Taking? Authorizing Provider  erythromycin ophthalmic ointment Place 1 application into the left eye 3 (three) times daily. 04/07/20  Yes Yaremi Stahlman, Linden Dolin, PA-C  albuterol (VENTOLIN HFA) 108 (90 Base) MCG/ACT inhaler Inhale 2 puffs into the lungs every 6 (six) hours as needed for wheezing or shortness of breath. 03/23/19   Sidney Ace, MD  blood glucose meter kit and supplies Dispense based on patient and insurance preference. Use up to four times daily as directed. (FOR  ICD-10 E10.9, E11.9). 03/26/19   Jennye Boroughs, MD  glipiZIDE (GLUCOTROL XL) 5 MG 24 hr tablet Take 1 tablet (5 mg total) by mouth daily with breakfast. 03/26/19   Jennye Boroughs, MD  lisinopril (ZESTRIL) 10 MG tablet Take 1 tablet (10 mg total) by mouth daily. 03/26/19   Jennye Boroughs, MD  metFORMIN (GLUCOPHAGE) 500 MG tablet Take 1 tablet (500 mg total) by mouth 2 (two) times daily with a meal. 03/26/19 03/25/20  Jennye Boroughs, MD  montelukast (SINGULAIR) 10 MG tablet Take 1 tablet (10 mg total) by mouth at bedtime. 03/23/19 06/21/19  Sidney Ace, MD    Allergies Shellfish allergy  Family History  Problem Relation Age of Onset  . Hypertension Other        The patient reports that almost all family members have high blood pressure    Social History Social History   Tobacco Use  . Smoking status: Current Every Day Smoker    Packs/day: 0.50    Types: Cigarettes  . Smokeless tobacco: Never Used  Substance Use Topics  . Alcohol use: No  . Drug use: No    Review of Systems  Constitutional: No fever/chills Eyes: No visual changes. ENT: Positive sore throat. Respiratory: Denies cough Cardiovascular: Denies chest pain Gastrointestinal: Denies abdominal pain Genitourinary: Negative for dysuria. Musculoskeletal: Negative for back pain. Skin: Negative for rash. Psychiatric: no mood changes,     ____________________________________________   PHYSICAL EXAM:  VITAL SIGNS: ED Triage Vitals  Enc Vitals Group     BP 04/07/20 1522 133/88  Pulse Rate 04/07/20 1522 82     Resp 04/07/20 1522 19     Temp 04/07/20 1522 98.5 F (36.9 C)     Temp src --      SpO2 04/07/20 1522 100 %     Weight 04/07/20 1521 298 lb (135.2 kg)     Height 04/07/20 1521 _0  (1.702 m)     Head Circumference --      Peak Flow --      Pain Score 04/07/20 1521 7     Pain Loc --      Pain Edu? --      Excl. in [place]? --     Constitutional: Alert and oriented. Well appearing and in no acute  distress. Eyes: Conjunctivae are normal.  Left lower lid has a stye  Head: Atraumatic. Nose: No congestion/rhinnorhea. Mouth/Throat: Mucous membranes are moist.  It is red Neck:  supple no lymphadenopathy noted Cardiovascular: Normal rate, regular rhythm. Heart sounds are normal Respiratory: Normal respiratory effort.  No retractions, lungs c t a  GU: deferred Musculoskeletal: FROM all extremities, warm and well perfused Neurologic:  Normal speech and language.  Skin:  Skin is warm, dry and intact. No rash noted. Psychiatric: Mood and affect are normal. Speech and behavior are normal.  ____________________________________________   LABS (all labs ordered are listed, but only abnormal results are displayed)  Labs Reviewed  GROUP A STREP BY PCR   ____________________________________________   ____________________________________________  RADIOLOGY    ____________________________________________   PROCEDURES  Procedure(s) performed: No  Procedures    ____________________________________________   INITIAL IMPRESSION / ASSESSMENT AND PLAN / ED COURSE  Pertinent labs & imaging results that were available during my care of the patient were reviewed by me and considered in my medical decision making (see chart for details).   Patient is 40 year old male presents with sore throat and left eye swelling.  He thinks he is having allergic reaction.  See HPI.  Physical exam shows patient per stable.  Strep test is negative  I do not feel this is an allergic reaction as patient actually has a stye on the left eye.  Pharynx is red with some swelling.  He can take over-the-counter Claritin or Benadryl.  He was given a prescription for erythromycin ophthalmic ointment.  He is apply a warm compress to the left eye.  Return emergency department worsening.  Discharged in stable condition.     Ryan Beck was evaluated in Emergency Department on 04/07/2020 for the symptoms  described in the history of present illness. He was evaluated in the context of the global COVID-19 pandemic, which necessitated consideration that the patient might be at risk for infection with the SARS-CoV-2 virus that causes COVID-19. Institutional protocols and algorithms that pertain to the evaluation of patients at risk for COVID-19 are in a state of rapid change based on information released by regulatory bodies including the CDC and federal and state organizations. These policies and algorithms were followed during the patient's care in the ED.    As part of my medical decision making, I reviewed the following data within the [place] notes reviewed and incorporated, Labs reviewed , Old chart reviewed, Notes from prior ED visits and  Controlled Substance Database  ____________________________________________   FINAL CLINICAL IMPRESSION(S) / ED DIAGNOSES  Final diagnoses:  Hordeolum externum of left lower eyelid  Allergy, initial encounter  Acute pharyngitis, unspecified etiology      NEW MEDICATIONS STARTED DURING THIS VISIT:  New Prescriptions   ERYTHROMYCIN OPHTHALMIC OINTMENT    Place 1 application into the left eye 3 (three) times daily.     Note:  This document was prepared using Dragon voice recognition software and may include unintentional dictation errors.    Versie Starks, PA-C 04/07/20 1630    Lucrezia Starch, MD 04/07/20 [phone]

## 2020-04-07 NOTE — Discharge Instructions (Signed)
Follow up with your regular doctor if not better in 3 to 5 days Return to the ER if worsening Take claritin or benadryl Apply warm compress to left eye Eye ointment as prescribed

## 2020-07-01 ENCOUNTER — Encounter: Payer: Self-pay | Admitting: Emergency Medicine

## 2020-07-01 ENCOUNTER — Other Ambulatory Visit: Payer: Self-pay

## 2020-07-01 ENCOUNTER — Emergency Department
Admission: EM | Admit: 2020-07-01 | Discharge: 2020-07-01 | Disposition: A | Discharge status: Home / Self Care | Payer: Self-pay | Attending: Emergency Medicine | Admitting: Emergency Medicine

## 2020-07-01 DIAGNOSIS — E119 Type 2 diabetes mellitus without complications: Secondary | ICD-10-CM | POA: Insufficient documentation

## 2020-07-01 DIAGNOSIS — M25552 Pain in left hip: Secondary | ICD-10-CM | POA: Insufficient documentation

## 2020-07-01 DIAGNOSIS — Z7984 Long term (current) use of oral hypoglycemic drugs: Secondary | ICD-10-CM | POA: Insufficient documentation

## 2020-07-01 DIAGNOSIS — F17210 Nicotine dependence, cigarettes, uncomplicated: Secondary | ICD-10-CM | POA: Insufficient documentation

## 2020-07-01 DIAGNOSIS — M5432 Sciatica, left side: Secondary | ICD-10-CM | POA: Insufficient documentation

## 2020-07-01 DIAGNOSIS — J45909 Unspecified asthma, uncomplicated: Secondary | ICD-10-CM | POA: Insufficient documentation

## 2020-07-01 MED ORDER — KETOROLAC TROMETHAMINE 30 MG/ML IJ SOLN
30.0000 mg | Freq: Once | INTRAMUSCULAR | Status: AC
  Administered 2020-07-01: 30 mg via INTRAMUSCULAR
  Filled 2020-07-01: qty 1

## 2020-07-01 MED ORDER — NAPROXEN 500 MG PO TABS: 500.0000 mg | ORAL_TABLET | Freq: Two times a day (BID) | ORAL | 0 refills | Status: AC

## 2020-07-01 NOTE — ED Provider Notes (Signed)
[facility] Emergency Department Provider Note  ____________________________________________   Event Date/Time   First MD Initiated Contact with Patient 07/01/20 0802     (approximate)  I have reviewed the triage vital signs and the nursing notes.   HISTORY  Chief Complaint Leg Pain and Hip Pain   HPI Ryan Beck is a 40 y.o. male presents to the ED with complaint of left hip pain with radiation down into his left leg.  Patient states he has had problems with his back in the past but no recent injury and currently is not taking any medication.  Patient denies any urinary difficulties, incontinence of bowel or bladder or saddle anesthesias.  Patient continues to ambulate without any assistance.  He rates pain as 10/10.       Past Medical History:  Diagnosis Date   Asthma     Patient Active Problem List   Diagnosis Date Noted   Bandemia 03/25/2019   Acute bronchitis 03/24/2019   Gram-positive cocci bacteremia 03/24/2019   Type 2 diabetes mellitus without complication ([place]) 35/82/5189   Asthma, chronic, unspecified asthma severity, with acute exacerbation 03/22/2019   Hypoxia 03/22/2019   Morbid obesity ([place]) 03/22/2019   Elevated hemoglobin A1c 03/22/2019   Elevated blood pressure reading 03/22/2019   Obstructive sleep apnea 03/22/2019   Leukocytosis 03/22/2019   Acute asthma exacerbation 03/22/2019    Past Surgical History:  Procedure Laterality Date   NO PAST SURGERIES      Prior to Admission medications   Medication Sig Start Date End Date Taking? Authorizing Provider  naproxen (NAPROSYN) 500 MG tablet Take 1 tablet (500 mg total) by mouth 2 (two) times daily with a meal. 07/01/20  Yes Letitia Neri L, PA-C  albuterol (VENTOLIN HFA) 108 (90 Base) MCG/ACT inhaler Inhale 2 puffs into the lungs every 6 (six) hours as needed for wheezing or shortness of breath. 03/23/19   Priscella Mann, Trula Slade, MD  blood glucose meter kit and supplies  Dispense based on patient and insurance preference. Use up to four times daily as directed. (FOR ICD-10 E10.9, E11.9). 03/26/19   Jennye Boroughs, MD  erythromycin ophthalmic ointment Place 1 application into the left eye 3 (three) times daily. 04/07/20   Fisher, Linden Dolin, PA-C  glipiZIDE (GLUCOTROL XL) 5 MG 24 hr tablet Take 1 tablet (5 mg total) by mouth daily with breakfast. 03/26/19   Jennye Boroughs, MD  lisinopril (ZESTRIL) 10 MG tablet Take 1 tablet (10 mg total) by mouth daily. 03/26/19   Jennye Boroughs, MD  metFORMIN (GLUCOPHAGE) 500 MG tablet Take 1 tablet (500 mg total) by mouth 2 (two) times daily with a meal. 03/26/19 03/25/20  Jennye Boroughs, MD  montelukast (SINGULAIR) 10 MG tablet Take 1 tablet (10 mg total) by mouth at bedtime. 03/23/19 06/21/19  Sidney Ace, MD    Allergies Shellfish allergy  Family History  Problem Relation Age of Onset   Hypertension Other        The patient reports that almost all family members have high blood pressure    Social History Social History   Tobacco Use   Smoking status: Every Day    Packs/day: 0.50    Pack years: 0.00    Types: Cigarettes   Smokeless tobacco: Never  Substance Use Topics   Alcohol use: No   Drug use: No    Review of Systems Constitutional: No fever/chills Eyes: No visual changes. Cardiovascular: Denies chest pain. Respiratory: Denies shortness of breath. Gastrointestinal: No abdominal pain.  No nausea, no vomiting.  Genitourinary: Negative for dysuria. Musculoskeletal: Positive for history of low back pain.  Positive left hip pain with radiation down left lower extremity. Skin: Negative for rash. Neurological: Negative for headaches, focal weakness or numbness.  ____________________________________________   PHYSICAL EXAM:  VITAL SIGNS: ED Triage Vitals  Enc Vitals Group     BP 07/01/20 0744 (!) 155/88     Pulse Rate 07/01/20 0744 92     Resp 07/01/20 0744 20     Temp 07/01/20 0744 98 F (36.7 C)      Temp Source 07/01/20 0744 Oral     SpO2 07/01/20 0744 95 %     Weight 07/01/20 0739 (!) 308 lb (139.7 kg)     Height 07/01/20 0739 _0  (1.676 m)     Head Circumference --      Peak Flow --      Pain Score 07/01/20 0739 10     Pain Loc --      Pain Edu? --      Excl. in [place]? --     Constitutional: Alert and oriented. Well appearing and in no acute distress.  Patient initially was in the room sleeping and had to be awake and for examination. Eyes: Conjunctivae are normal.  Head: Atraumatic. Neck: No stridor.   Cardiovascular: Normal rate, regular rhythm. Grossly normal heart sounds.  Good peripheral circulation. Respiratory: Normal respiratory effort.  No retractions. Lungs CTAB. Gastrointestinal: Soft and nontender. No distention.  No CVA tenderness. Musculoskeletal: Examination of the lower back there is no gross deformity and no point tenderness on palpation of the lumbar spine.  There is on the left SI joint area moderate tenderness.  Straight leg raises were negative.  Good muscle strength bilaterally. Neurologic:  Normal speech and language.  Reflexes are 1+ bilaterally.  No gross focal neurologic deficits are appreciated. No gait instability. Skin:  Skin is warm, dry and intact. No rash noted. Psychiatric: Mood and affect are normal. Speech and behavior are normal.  ____________________________________________   LABS (all labs ordered are listed, but only abnormal results are displayed)  Labs Reviewed - No data to display ____________________________________________     PROCEDURES  Procedure(s) performed (including Critical Care):  Procedures   ____________________________________________   INITIAL IMPRESSION / ASSESSMENT AND PLAN / ED COURSE  As part of my medical decision making, I reviewed the following data within the electronic MEDICAL RECORD NUMBER Notes from prior ED visits and [place] Controlled Substance Database  40 year old male presents to the ED with  complaint of left sided hip pain with radiculopathy to his left lower extremity.  He denies any previous injuries to his hip but states that he has had problems with his back for many years and currently has not seen by a medical doctor for this.  He denies any recent injury to his back.  He denies any urinary symptoms, incontinence of bowel or bladder or saddle anesthesias.  Patient is ambulatory without any assistance.  On exam there is marked tenderness on palpation of the left SI joint area and surrounding tissue.  Straight leg raises were negative.  Was made aware that his findings were suggestive of sciatica.  He was started on naproxen 500 mg twice daily with food he is to follow-up with his PCP or urgent care if any continued problems.  Patient was ambulatory at the time of discharge. ____________________________________________   FINAL CLINICAL IMPRESSION(S) / ED DIAGNOSES  Final diagnoses:  Sciatica of left side  ED Discharge Orders          Ordered    naproxen (NAPROSYN) 500 MG tablet  2 times daily with meals        07/01/20 6691             Note:  This document was prepared using Dragon voice recognition software and may include unintentional dictation errors.    Johnn Hai, PA-C 07/01/20 1304    Harvest Dark, MD 07/01/20 1520

## 2020-07-01 NOTE — ED Triage Notes (Signed)
Pt comes into the ED via POV c/o left hip and leg pain.  Pt states that the pain is a shooting pain through the hip and down the leg.  Pt denies any known injury and is ambulatory to triage at this time.

## 2020-07-01 NOTE — Discharge Instructions (Addendum)
Follow-up with your primary care provider or the walk-in clinic at [facility] if any continued problems or not improving.  You may use ice or heat to your back as needed for discomfort.  Begin taking naproxen 500 mg twice daily with food.  Sciatica can last for several days or into several weeks.  If any severe worsening such as loss of bowel or bladder control you should return to the emergency department immediately.

## 2020-07-05 ENCOUNTER — Encounter: Payer: Self-pay | Admitting: Emergency Medicine

## 2020-07-05 ENCOUNTER — Emergency Department
Admission: EM | Admit: 2020-07-05 | Discharge: 2020-07-05 | Disposition: A | Discharge status: Home / Self Care | Payer: Self-pay | Attending: Emergency Medicine | Admitting: Emergency Medicine

## 2020-07-05 ENCOUNTER — Other Ambulatory Visit: Payer: Self-pay

## 2020-07-05 DIAGNOSIS — J45909 Unspecified asthma, uncomplicated: Secondary | ICD-10-CM | POA: Insufficient documentation

## 2020-07-05 DIAGNOSIS — F17210 Nicotine dependence, cigarettes, uncomplicated: Secondary | ICD-10-CM | POA: Insufficient documentation

## 2020-07-05 DIAGNOSIS — E119 Type 2 diabetes mellitus without complications: Secondary | ICD-10-CM | POA: Insufficient documentation

## 2020-07-05 DIAGNOSIS — M5442 Lumbago with sciatica, left side: Secondary | ICD-10-CM | POA: Insufficient documentation

## 2020-07-05 DIAGNOSIS — Z7984 Long term (current) use of oral hypoglycemic drugs: Secondary | ICD-10-CM | POA: Insufficient documentation

## 2020-07-05 MED ORDER — TIZANIDINE HCL 4 MG PO TABS: 4.0000 mg | ORAL_TABLET | Freq: Three times a day (TID) | ORAL | 0 refills | Status: AC

## 2020-07-05 MED ORDER — ORPHENADRINE CITRATE 30 MG/ML IJ SOLN
60.0000 mg | Freq: Once | INTRAMUSCULAR | Status: AC
  Administered 2020-07-05: 60 mg via INTRAMUSCULAR
  Filled 2020-07-05: qty 2

## 2020-07-05 MED ORDER — OXYCODONE HCL 5 MG PO TABS
10.0000 mg | ORAL_TABLET | Freq: Once | ORAL | Status: DC
  Filled 2020-07-05: qty 2

## 2020-07-05 MED ORDER — KETOROLAC TROMETHAMINE 10 MG PO TABS: 10.0000 mg | ORAL_TABLET | Freq: Four times a day (QID) | ORAL | 0 refills | Status: AC | PRN

## 2020-07-05 MED ORDER — KETOROLAC TROMETHAMINE 60 MG/2ML IM SOLN
30.0000 mg | Freq: Once | INTRAMUSCULAR | Status: AC
  Administered 2020-07-05: 30 mg via INTRAMUSCULAR
  Filled 2020-07-05: qty 2

## 2020-07-05 NOTE — ED Notes (Signed)
Pt sleeping and aroused by verbal stimulus at dc. Pt states 8/10 pain and then falling back to sleep. VSS. Pt wheeled to  Lobby. Pt c/o not enough time off on dc. EDP notified.

## 2020-07-05 NOTE — ED Triage Notes (Signed)
Pt comes into the ED via POV c/o left leg nerve pain.  Pt seen earlier this week for the same thing.  Pt states the pain starts in the hip area and radiates into the groin and down the leg.  Pt in NAD at this time.

## 2020-07-05 NOTE — ED Notes (Addendum)
Pt sleeping and snoring when this RN entered room, pt falling back to sleep after answering questions. Pt states pain is 11/10, then falling asleep. Pt responding to verbal stimulus. Pt states he has left hip and leg pain. Pt able to move freely in bed.

## 2020-07-05 NOTE — ED Provider Notes (Signed)
[facility] Emergency Department Provider Note ____________________________________________  Time seen: Approximately 8:45 AM  I have reviewed the triage vital signs and the nursing notes.  HISTORY  Chief Complaint Leg Pain   HPI Ryan Beck is a 40 y.o. male presents to the emergency department for acute onset of the left lower back pain that radiates into the left lower extremity.  No specific injury. No alleviating measures prior to arrival.   Past Medical History:  Diagnosis Date   Asthma     Patient Active Problem List   Diagnosis Date Noted   Bandemia 03/25/2019   Acute bronchitis 03/24/2019   Gram-positive cocci bacteremia 03/24/2019   Type 2 diabetes mellitus without complication ([place]) 11/94/1740   Asthma, chronic, unspecified asthma severity, with acute exacerbation 03/22/2019   Hypoxia 03/22/2019   Morbid obesity ([place]) 03/22/2019   Elevated hemoglobin A1c 03/22/2019   Elevated blood pressure reading 03/22/2019   Obstructive sleep apnea 03/22/2019   Leukocytosis 03/22/2019   Acute asthma exacerbation 03/22/2019    Past Surgical History:  Procedure Laterality Date   NO PAST SURGERIES      Prior to Admission medications   Medication Sig Start Date End Date Taking? Authorizing Provider  ketorolac (TORADOL) 10 MG tablet Take 1 tablet (10 mg total) by mouth every 6 (six) hours as needed. 07/05/20  Yes Aalyah Mansouri B, FNP  tiZANidine (ZANAFLEX) 4 MG tablet Take 1 tablet (4 mg total) by mouth 3 (three) times daily. 07/05/20  Yes Veronda Gabor B, FNP  albuterol (VENTOLIN HFA) 108 (90 Base) MCG/ACT inhaler Inhale 2 puffs into the lungs every 6 (six) hours as needed for wheezing or shortness of breath. 03/23/19   Priscella Mann, Trula Slade, MD  blood glucose meter kit and supplies Dispense based on patient and insurance preference. Use up to four times daily as directed. (FOR ICD-10 E10.9, E11.9). 03/26/19   Jennye Boroughs, MD  erythromycin ophthalmic  ointment Place 1 application into the left eye 3 (three) times daily. 04/07/20   Fisher, Linden Dolin, PA-C  glipiZIDE (GLUCOTROL XL) 5 MG 24 hr tablet Take 1 tablet (5 mg total) by mouth daily with breakfast. 03/26/19   Jennye Boroughs, MD  lisinopril (ZESTRIL) 10 MG tablet Take 1 tablet (10 mg total) by mouth daily. 03/26/19   Jennye Boroughs, MD  metFORMIN (GLUCOPHAGE) 500 MG tablet Take 1 tablet (500 mg total) by mouth 2 (two) times daily with a meal. 03/26/19 03/25/20  Jennye Boroughs, MD  montelukast (SINGULAIR) 10 MG tablet Take 1 tablet (10 mg total) by mouth at bedtime. 03/23/19 06/21/19  Sidney Ace, MD  naproxen (NAPROSYN) 500 MG tablet Take 1 tablet (500 mg total) by mouth 2 (two) times daily with a meal. 07/01/20   Johnn Hai, PA-C    Allergies Shellfish allergy  Family History  Problem Relation Age of Onset   Hypertension Other        The patient reports that almost all family members have high blood pressure    Social History Social History   Tobacco Use   Smoking status: Every Day    Packs/day: 0.50    Pack years: 0.00    Types: Cigarettes   Smokeless tobacco: Never  Substance Use Topics   Alcohol use: No   Drug use: No    Review of Systems Constitutional: Uncomfortable appearing. Respiratory: Negative for dyspnea. Cardiovascular: Negative for change in skin temperature or color. Musculoskeletal:   Negative for chronic steroid use   Negative for trauma  in the presence of osteoporosis  Negative for age over 73 and trauma.  Negative for constitutional symptoms, or history of cancer  Negative for pain worse at night. Skin: Negative for rash, lesion, or wound.  Genitourinary: Negative for urinary retention. Rectal: Negative for fecal incontinence or new onset constipation/bowel habit changes. Hematological/Immunilogical: Negative for immunosuppression, IV drug use, or fever Neurological: Positive for burning, tingling, numb, electric, radiating pain in the left  lower extremity.                        Negative for saddle anesthesia.                        Negative for focal neurologic deficit, progressive or disabling symptoms             Negative for saddle anesthesia. ____________________________________________   PHYSICAL EXAM:  VITAL SIGNS: ED Triage Vitals  Enc Vitals Group     BP 07/05/20 0838 131/88     Pulse Rate 07/05/20 0838 77     Resp 07/05/20 0838 20     Temp 07/05/20 0838 98.5 F (36.9 C)     Temp Source 07/05/20 0838 Oral     SpO2 07/05/20 0838 97 %     Weight 07/05/20 0838 (!) 307 lb 15.7 oz (139.7 kg)     Height 07/05/20 0838 '5\' 6"'  (1.676 m)     Head Circumference --      Peak Flow --      Pain Score 07/05/20 0838 10     Pain Loc --      Pain Edu? --      Excl. in [place]? --     Constitutional: Alert and oriented. Well appearing and in no acute distress. Eyes: Conjunctivae are clear without discharge or drainage.  Head: Atraumatic.  Neck: Full, active range of motion. Respiratory: Respirations even and unlabored. Musculoskeletal: Full ROM of the back and extremities, Strength 5/5 of the lower extremities as tested. Neurologic: Reflexes of the lower extremities are 2+. Negative straight leg raise on the left side. Skin: Atraumatic.  Psychiatric: Behavior and affect are normal.  ____________________________________________   LABS (all labs ordered are listed, but only abnormal results are displayed)  Labs Reviewed - No data to display ____________________________________________  RADIOLOGY  Not indicated. ____________________________________________   PROCEDURES  Procedure(s) performed:  Procedures ____________________________________________   INITIAL IMPRESSION / ASSESSMENT AND PLAN / ED COURSE  Ryan Beck is a 40 y.o. male presents to the emergency department for treatment evaluation left low back pain that radiates into the left lower extremity.  See HPI for further details.  Once patient  arrived to room, he was able to fall asleep.  Medications given and he rested a while longer and then after discussion he will be discharged home.  Patient requests work note.  Single day provided.  Patient not satisfied with 1 day off.  He is ambulatory without assistance and prescribed medication that should control pain and therefore no additional days provided.  Medications  ketorolac (TORADOL) injection 30 mg (30 mg Intramuscular Given 07/05/20 0928)  orphenadrine (NORFLEX) injection 60 mg (60 mg Intramuscular Given 07/05/20 0929)    ED Discharge Orders          Ordered    ketorolac (TORADOL) 10 MG tablet  Every 6 hours PRN       Note to Pharmacy: Injection given in ER   07/05/20 1105  tiZANidine (ZANAFLEX) 4 MG tablet  3 times daily        07/05/20 1105             Pertinent labs & imaging results that were available during my care of the patient were reviewed by me and considered in my medical decision making (see chart for details).   _________________________________________   FINAL CLINICAL IMPRESSION(S) / ED DIAGNOSES  Final diagnoses:  Acute back pain with sciatica, left     If controlled substance prescribed during this visit, 12 month history viewed on the [place] prior to issuing an initial prescription for Schedule II or III opiod.    Victorino Dike, FNP 07/05/20 1204    Vanessa [place], MD 07/05/20 [phone]

## 2021-01-05 ENCOUNTER — Other Ambulatory Visit: Payer: Self-pay

## 2021-01-05 ENCOUNTER — Emergency Department
Admission: EM | Admit: 2021-01-05 | Discharge: 2021-01-05 | Disposition: A | Discharge status: Home / Self Care | Payer: Self-pay | Attending: Student in an Organized Health Care Education/Training Program | Admitting: Student in an Organized Health Care Education/Training Program

## 2021-01-05 DIAGNOSIS — L03314 Cellulitis of groin: Secondary | ICD-10-CM | POA: Insufficient documentation

## 2021-01-05 MED ORDER — SULFAMETHOXAZOLE-TRIMETHOPRIM 800-160 MG PO TABS
1.0000 | ORAL_TABLET | Freq: Once | ORAL | Status: AC
  Administered 2021-01-05: 1 via ORAL
  Filled 2021-01-05: qty 1

## 2021-01-05 MED ORDER — SULFAMETHOXAZOLE-TRIMETHOPRIM 800-160 MG PO TABS: 1.0000 | ORAL_TABLET | Freq: Two times a day (BID) | ORAL | 0 refills | Status: AC

## 2021-01-05 MED ORDER — MUPIROCIN CALCIUM 2 % EX CREA: TOPICAL_CREAM | CUTANEOUS | 0 refills | Status: AC

## 2021-01-05 NOTE — ED Triage Notes (Signed)
Pt to ED for "boils popping up everywhere". States started last week. Reports abscesses draining.  Denies fevers.

## 2021-01-05 NOTE — ED Provider Notes (Signed)
[facility] Provider Note  Patient Contact: 8:24 PM (approximate)   History   Abscess   HPI  Ryan Beck is a 41 y.o. male who presented to the emergency department for evaluation of possible abscess in the left groin.  Patient has had ongoing symptoms for roughly a week but then developed worsening pain and swelling.  Drainage already appreciated.  No systemic complaints.  Patient has a history of recurrent abscesses.  Patient typically receives episodic treatment but has not received ongoing treatment.     Physical Exam   Triage Vital Signs: ED Triage Vitals  Enc Vitals Group     BP 01/05/21 1638 120/78     Pulse Rate 01/05/21 1638 69     Resp 01/05/21 1637 20     Temp 01/05/21 1637 98.2 F (36.8 C)     Temp Source 01/05/21 1637 Oral     SpO2 01/05/21 1637 96 %     Weight 01/05/21 1637 275 lb (124.7 kg)     Height 01/05/21 1637 5\' 6"  (1.676 m)     Head Circumference --      Peak Flow --      Pain Score 01/05/21 1637 8     Pain Loc --      Pain Edu? --      Excl. in [place]? --     Most recent vital signs: Vitals:   01/05/21 1637 01/05/21 1638  BP:  120/78  Pulse:  69  Resp: 20   Temp: 98.2 F (36.8 C)   SpO2: 96%      General: Alert and in no acute distress.   Cardiovascular:  Good peripheral perfusion Respiratory: Normal respiratory effort without tachypnea or retractions. Lungs CTAB. Musculoskeletal: Full range of motion to all extremities.  Neurologic:  No gross focal neurologic deficits are appreciated.  Skin:   No rash noted visualization of the left groin reveals an erythematous and edematous area.  Palpation well some firmness but no fluctuance. Other:   ED Results / Procedures / Treatments   Labs (all labs ordered are listed, but only abnormal results are displayed) Labs Reviewed - No data to display   EKG     RADIOLOGY    No results found.  PROCEDURES:  Critical Care performed: No  Ultrasound ED Soft  Tissue  Date/Time: 01/05/2021 8:48 PM Performed by: Darletta Moll, PA-C Authorized by: Darletta Moll, PA-C   Procedure details:    Indications: localization of abscess and evaluate for cellulitis     Transverse view:  Visualized   Longitudinal view:  Visualized   Images: not archived   Location:    Location: groin     Side:  Left Findings:     no abscess present    cellulitis present   MEDICATIONS ORDERED IN ED: Medications  sulfamethoxazole-trimethoprim (BACTRIM DS) 800-160 MG per tablet 1 tablet (1 tablet Oral Given 01/05/21 2123)     IMPRESSION / MDM / Helper / ED COURSE  I reviewed the triage vital signs and the nursing notes.                              Differential diagnosis includes, but is not limited to, abscess, cellulitis , Lymphogranuloma venereum    Patient's diagnosis is consistent with cellulitis of the left groin.  Patient presented to the emergency department with a painful lesion to left groin.  Already draining at  home.  History of recurrent abscesses but is never been treated for MRSA colonization.  With firm lesion in the left groin.  No evidence of drainable abscess.  Bedside ultrasound.  Evaluation does not appear concerning for LGV.  Patient is referred to dermatology as he is likely colonized.  He received Bactrim, mupirocin intranasally.  He can follow-up with dermatology.  Return precautions discussed with the patient.  Patient is given ED precautions to return to the ED for any worsening or new symptoms.        FINAL CLINICAL IMPRESSION(S) / ED DIAGNOSES   Final diagnoses:  Cellulitis of left groin     Rx / DC Orders   ED Discharge Orders          Ordered    sulfamethoxazole-trimethoprim (BACTRIM DS) 800-160 MG tablet  2 times daily        01/05/21 2052    mupirocin cream (BACTROBAN) 2 %        01/05/21 2052             Note:  This document was prepared using Dragon voice recognition software and  may include unintentional dictation errors.   Brynda Peon 01/09/21 1738    Carrie Mew, MD 01/09/21 2240

## 2021-01-21 ENCOUNTER — Other Ambulatory Visit: Payer: Self-pay

## 2021-01-21 ENCOUNTER — Encounter: Payer: Self-pay | Admitting: Emergency Medicine

## 2021-01-21 ENCOUNTER — Emergency Department
Admission: EM | Admit: 2021-01-21 | Discharge: 2021-01-21 | Disposition: A | Discharge status: Home / Self Care | Payer: Self-pay | Attending: Emergency Medicine | Admitting: Emergency Medicine

## 2021-01-21 DIAGNOSIS — U071 COVID-19: Secondary | ICD-10-CM | POA: Insufficient documentation

## 2021-01-21 LAB — RESP PANEL BY RT-PCR (FLU A&B, COVID) ARPGX2
Influenza A by PCR: NEGATIVE
Influenza B by PCR: NEGATIVE
SARS Coronavirus 2 by RT PCR: POSITIVE — AB

## 2021-01-21 IMAGING — CR DG CHEST 2V
1 series · 2 of 2 positions shown · non-contrast
Comparison: Radiograph 03/22/2019

CLINICAL DATA: Cough, wheezing, positive blood cultures

EXAM:
CHEST - 2 VIEW

[Series 1: dg chest 2 view · 0.14mm/px · 2 of 2 slices shown]
[im 1/2]
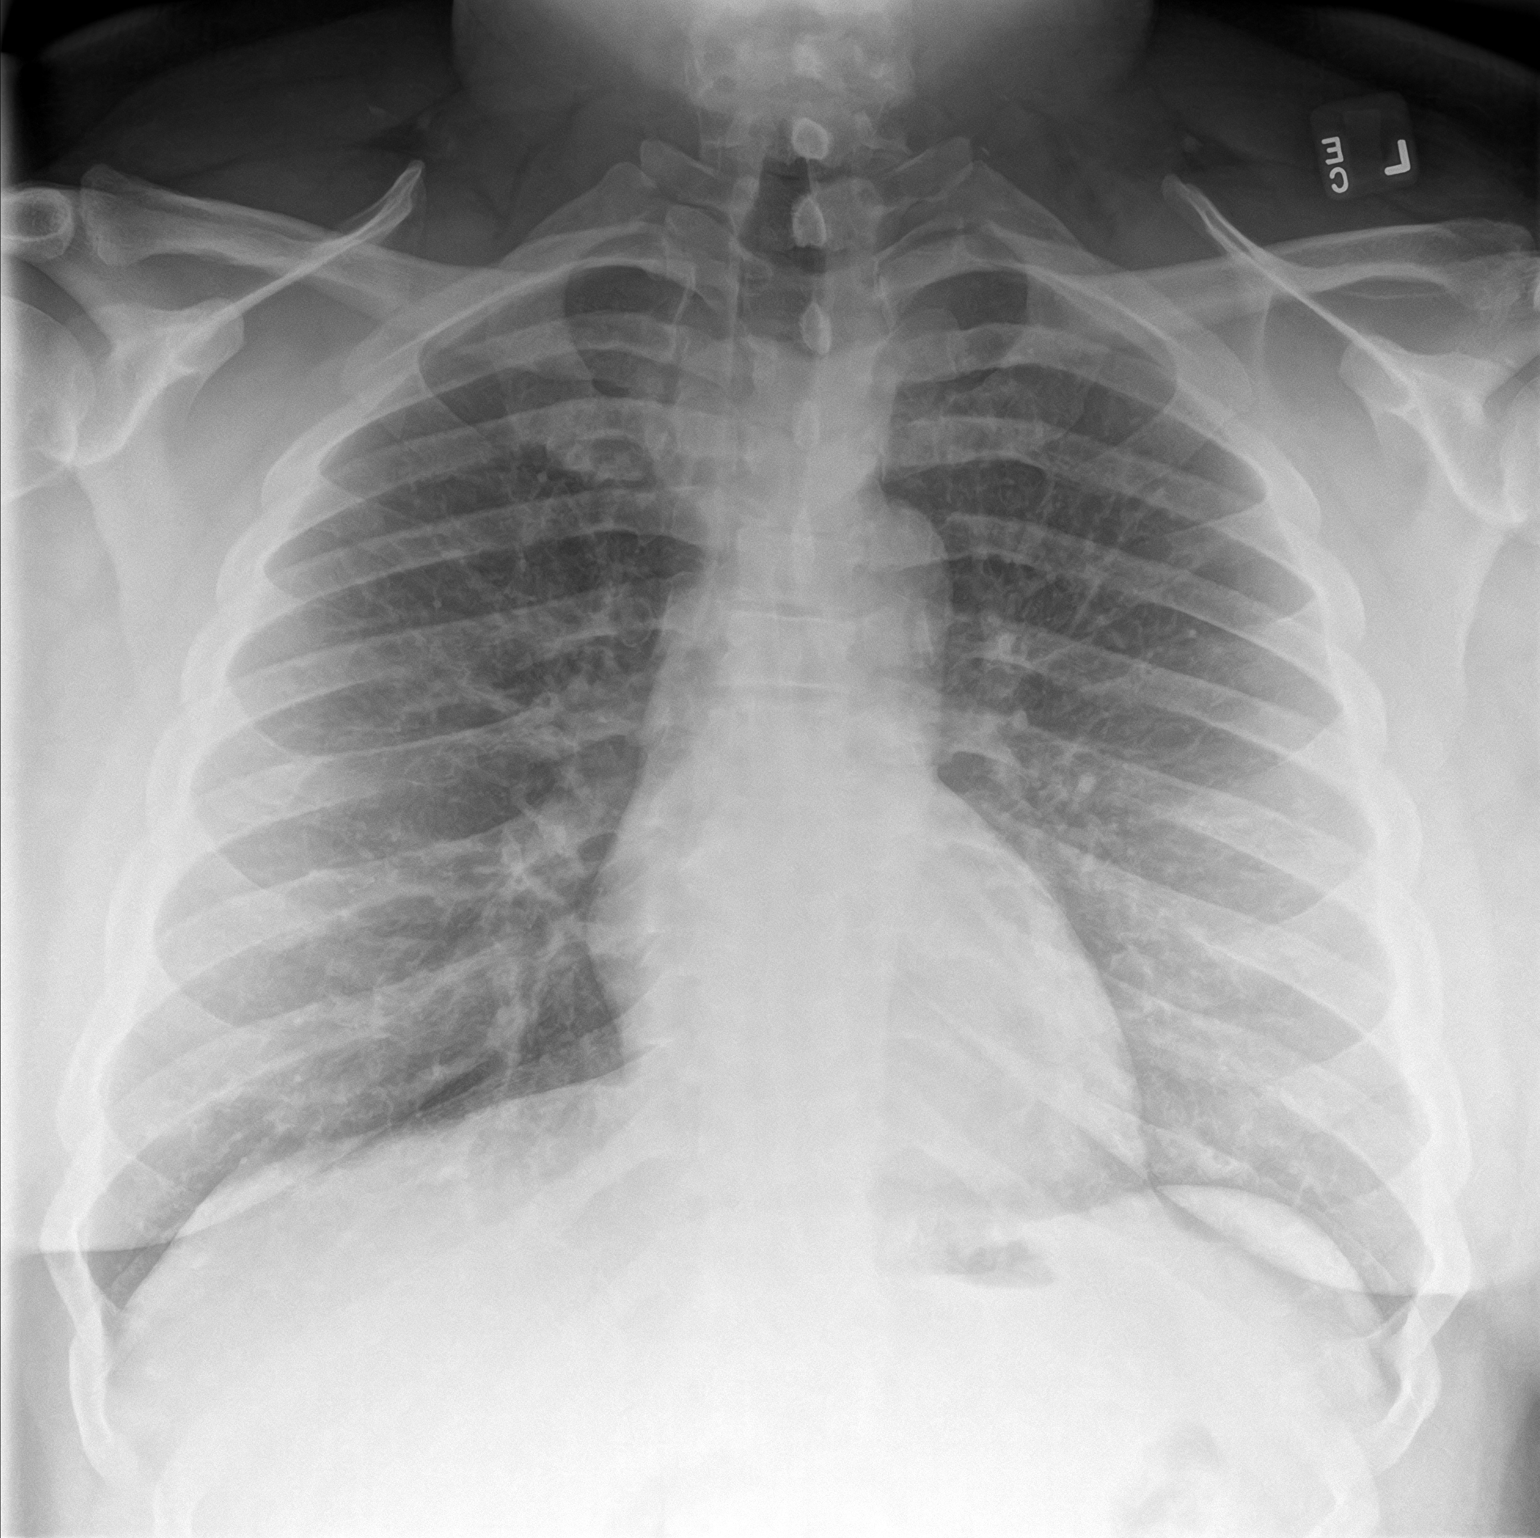
[im 2/2]
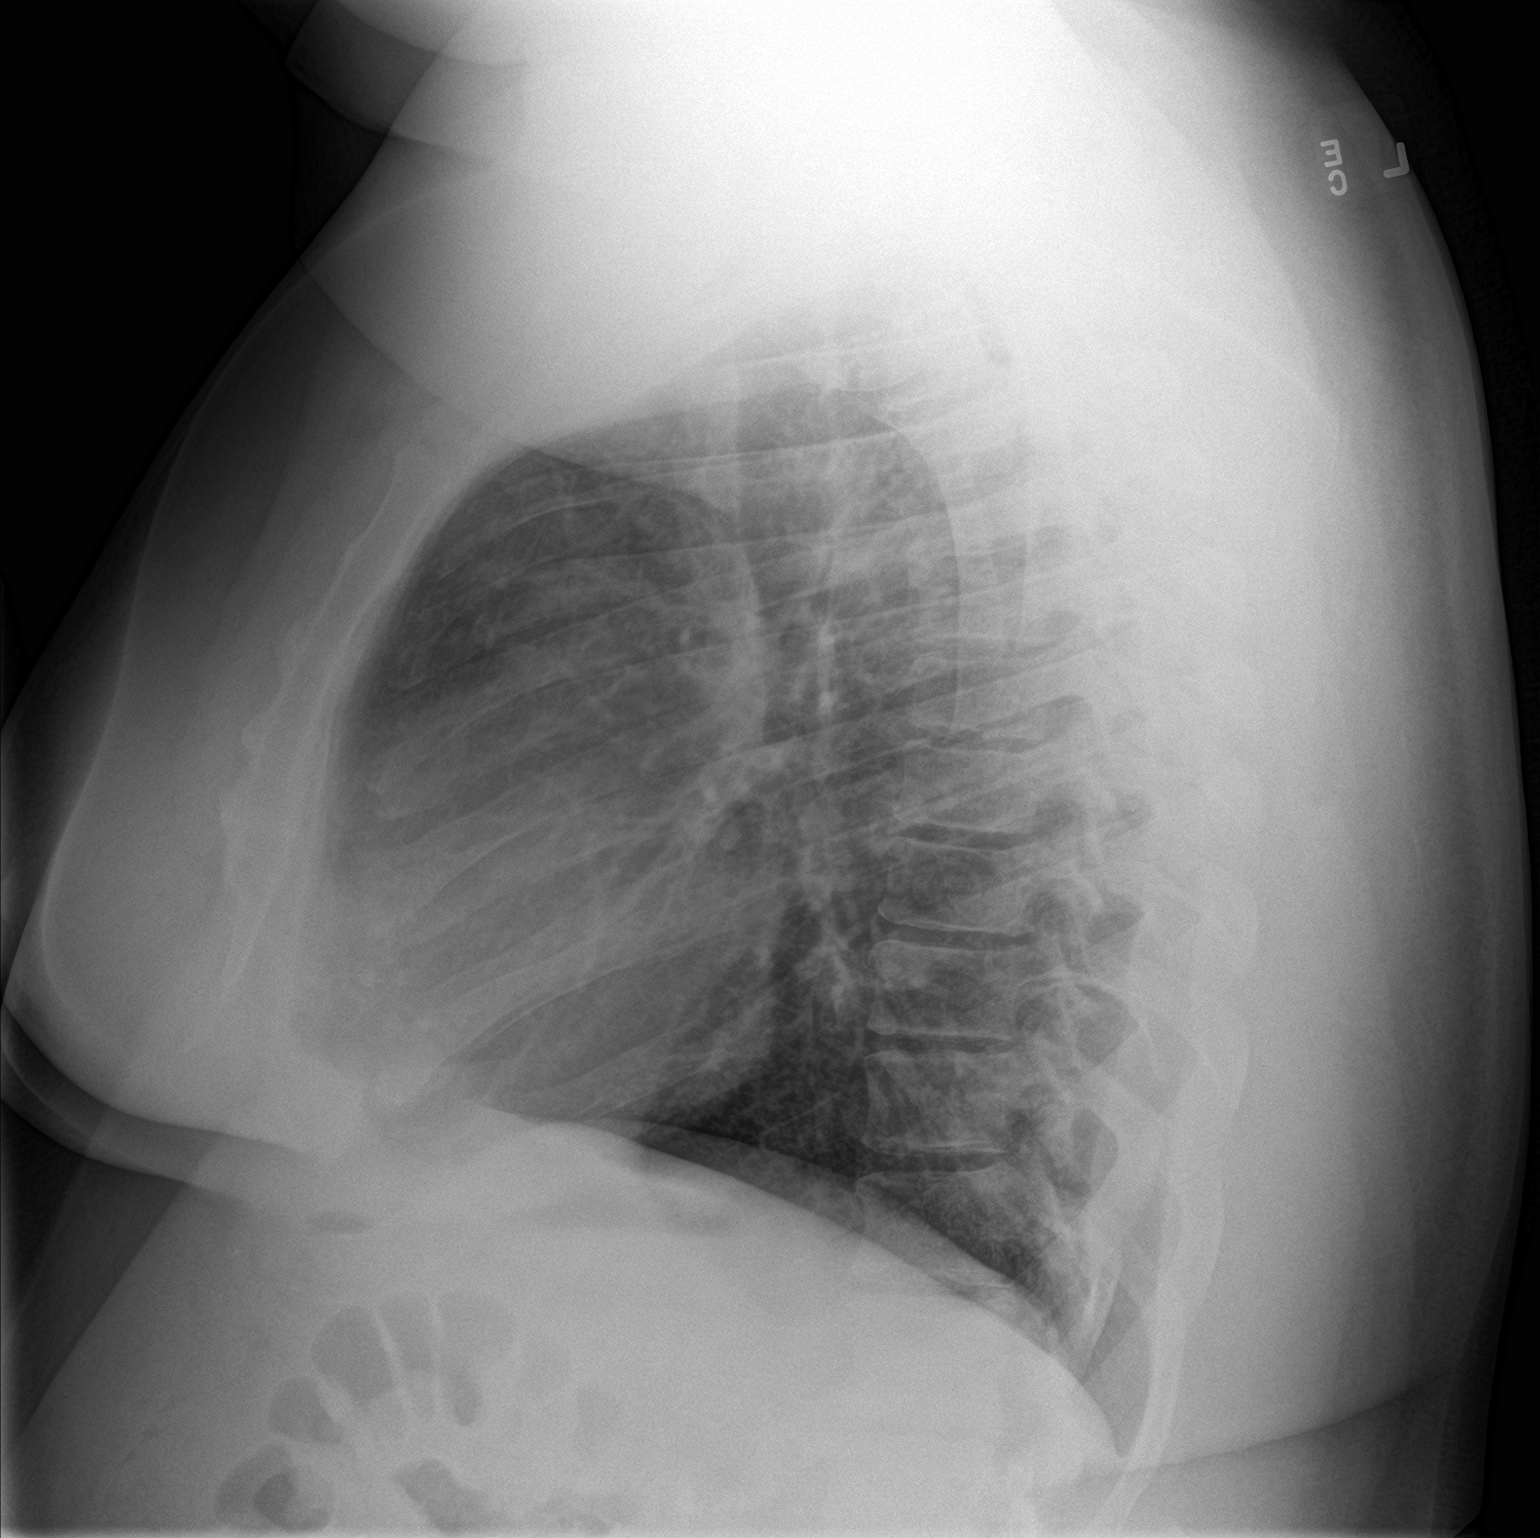

[2 of 2 positions shown; findings below may reference images not displayed]

FINDINGS: Minimal airways thickening could reflect reactive airways disease or
bronchitis. No consolidation, features of edema, pneumothorax, or
effusion. More readily apparent on today's examination is some
increased attenuation at the level of the upper mediastinum with
questionable retrosternal thickening on the lateral radiograph.
IMPRESSION: Minimal airways thickening could reflect reactive airways disease or
bronchitis. No consolidation.

Question some increased attenuation at the level of manubrium with
retrosternal density on lateral radiograph. In the setting of
bacteremia, cannot exclude possible osteomyelitis. Consider further
evaluation with CT.

These results were called by telephone at the time of interpretation
on 03/24/2019 at [DATE] to provider AVDULLAH MINUTKA , who verbally
acknowledged these results.

## 2021-01-21 NOTE — ED Triage Notes (Signed)
Patient ambulatory to triage with steady gait, without difficulty or distress noted; pt reports nausea and bodyaches today

## 2021-01-21 NOTE — ED Notes (Signed)
Pt to ED c/o N/V, Body aches, and cough. Pt states it started this morning, denies being around anyone sick.   Pt is A&Ox4.

## 2021-01-21 NOTE — ED Provider Notes (Signed)
[facility] Provider Note  Patient Contact: 9:45 PM (approximate)   History   No chief complaint on file.   HPI  Ryan Beck is a 41 y.o. male presents to the emergency department with chills and body aches that started today.  He denies cough, chest pain or chest tightness.  No sick contacts in the home with similar symptoms.      Physical Exam   Triage Vital Signs: ED Triage Vitals  Enc Vitals Group     BP 01/21/21 2019 104/69     Pulse Rate 01/21/21 2019 85     Resp 01/21/21 2019 20     Temp 01/21/21 2019 98.5 F (36.9 C)     Temp Source 01/21/21 2019 Oral     SpO2 01/21/21 2019 99 %     Weight 01/21/21 2018 280 lb (127 kg)     Height 01/21/21 2018 5\' 6"  (1.676 m)     Head Circumference --      Peak Flow --      Pain Score 01/21/21 2017 8     Pain Loc --      Pain Edu? --      Excl. in [place]? --     Most recent vital signs: Vitals:   01/21/21 2019  BP: 104/69  Pulse: 85  Resp: 20  Temp: 98.5 F (36.9 C)  SpO2: 99%    Constitutional: Alert and oriented. Patient is lying supine. Eyes: Conjunctivae are normal. PERRL. EOMI. Head: Atraumatic. ENT:      Ears: Tympanic membranes are mildly injected with mild effusion bilaterally.       Nose: No congestion/rhinnorhea.      Mouth/Throat: Mucous membranes are moist. Posterior pharynx is mildly erythematous.  Hematological/Lymphatic/Immunilogical: No cervical lymphadenopathy.  Cardiovascular: Normal rate, regular rhythm. Normal S1 and S2.  Good peripheral circulation. Respiratory: Normal respiratory effort without tachypnea or retractions. Lungs CTAB. Good air entry to the bases with no decreased or absent breath sounds. Gastrointestinal: Bowel sounds 4 quadrants. Soft and nontender to palpation. No guarding or rigidity. No palpable masses. No distention. No CVA tenderness. Musculoskeletal: Full range of motion to all extremities. No gross deformities appreciated. Neurologic:  Normal  speech and language. No gross focal neurologic deficits are appreciated.  Skin:  Skin is warm, dry and intact. No rash noted. Psychiatric: Mood and affect are normal. Speech and behavior are normal. Patient exhibits appropriate insight and judgement.    ED Results / Procedures / Treatments   Labs (all labs ordered are listed, but only abnormal results are displayed) Labs Reviewed  RESP PANEL BY RT-PCR (FLU A&B, COVID) ARPGX2 - Abnormal; Notable for the following components:      Result Value   SARS Coronavirus 2 by RT PCR POSITIVE (*)    All other components within normal limits           MEDICATIONS ORDERED IN ED: Medications - No data to display   IMPRESSION / MDM / ASSESSMENT AND PLAN / ED COURSE  I reviewed the triage vital signs and the nursing notes.                              Differential diagnosis includes, but is not limited to, COVID-19, influenza, unspecified viral URI.  Assessment and plan 41 year old male presents to the emergency department with viral URI-like symptoms for the past 24 hours.  Vital signs are reassuring at triage.  On  physical exam, patient was alert, active and nontoxic-appearing.  COVID, flu and RSV testing were obtained which shows that patient is COVID-19 positive.  Rest and hydration were encouraged at home.  Tylenol and ibuprofen alternating were recommended for fever.      FINAL CLINICAL IMPRESSION(S) / ED DIAGNOSES   Final diagnoses:  COVID-19     Rx / DC Orders   ED Discharge Orders     None        Note:  This document was prepared using Dragon voice recognition software and may include unintentional dictation errors.   Pia Mau [place], Cordelia Poche 01/21/21 2146    Gilles Chiquito, MD 01/22/21 1006

## 2021-01-23 IMAGING — DX DG CHEST 1V PORT
1 series · 1 of 1 positions shown · non-contrast
Comparison: 03/24/2019, 03/22/2019, chest CT 03/24/2019

CLINICAL DATA: 38-year-old male with a history of asthma
exacerbation

EXAM:
PORTABLE CHEST 1 VIEW

[chest ap]
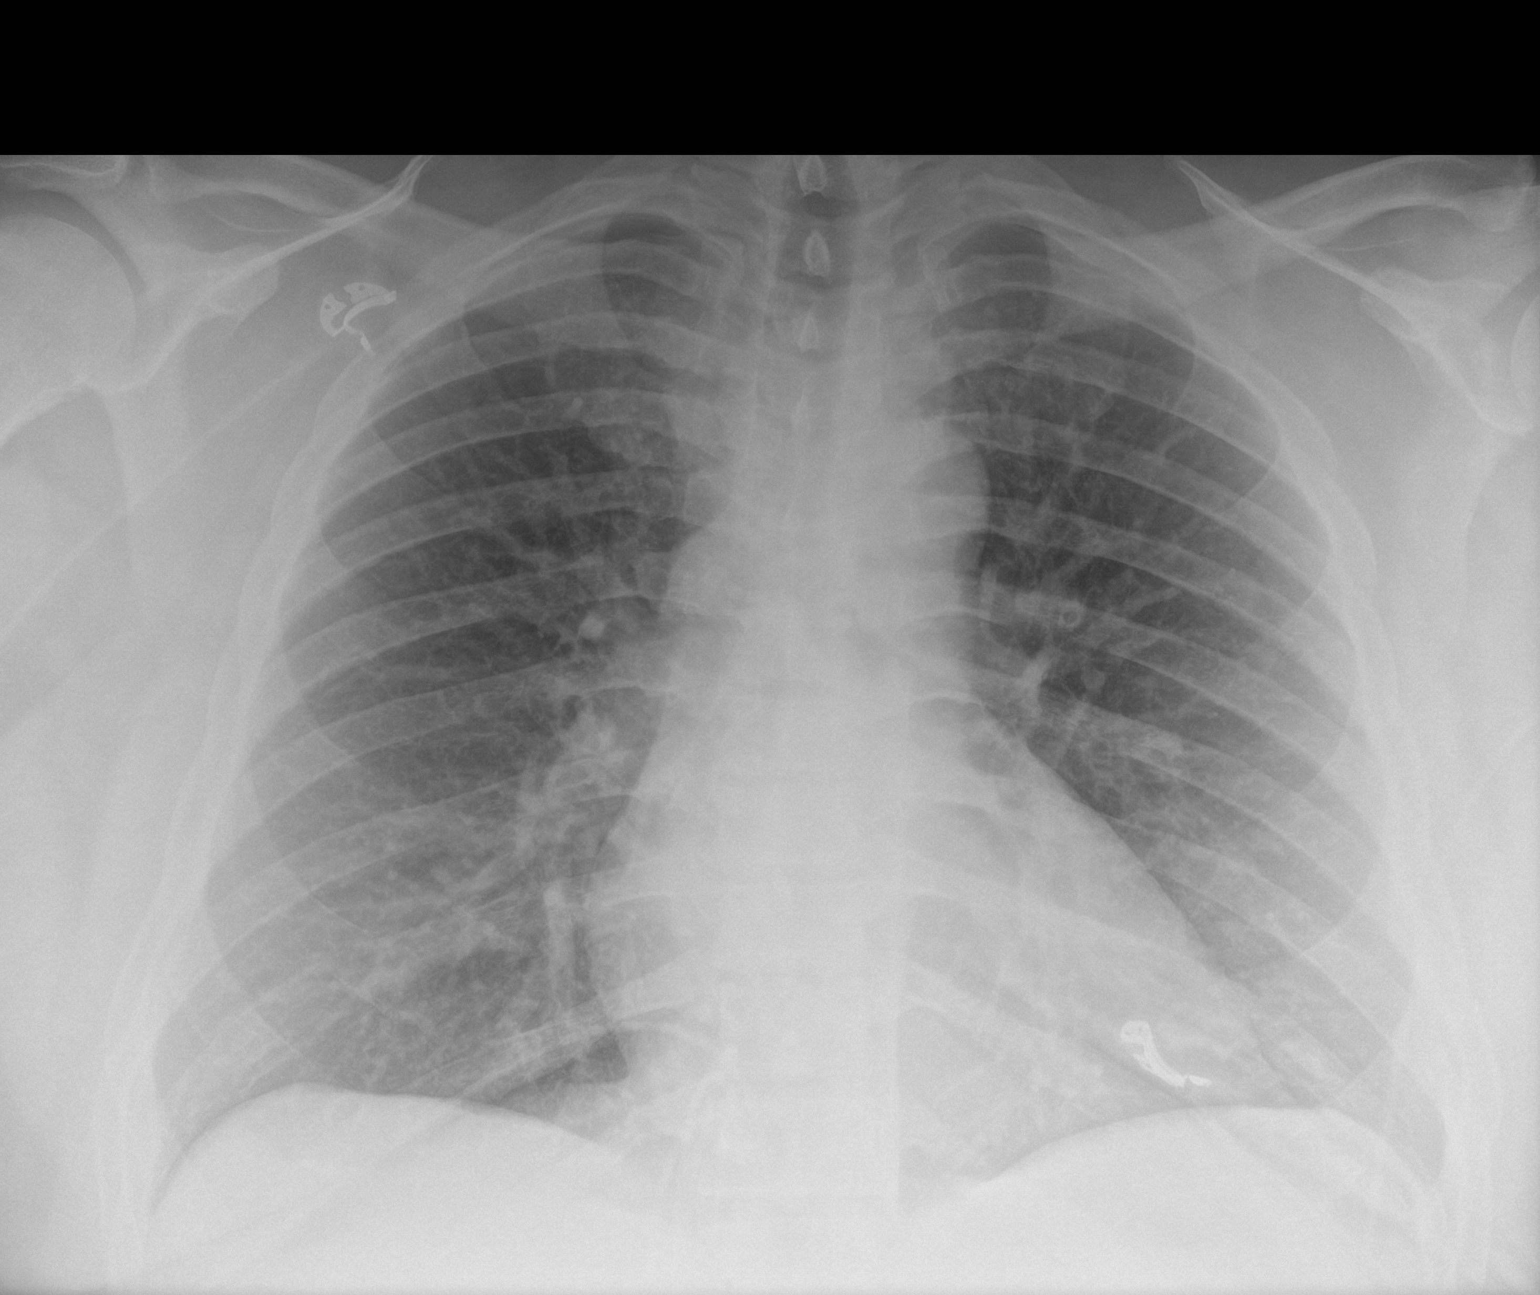

[1 of 1 positions shown; findings below may reference images not displayed]

FINDINGS: Cardiomediastinal silhouette unchanged in size and contour. EKG
leads projecting over the chest.

No pneumothorax.  No large pleural effusion.

No hyperinflation. Coarsened interstitial markings similar to prior.

No confluent airspace disease.  No displaced fracture.
IMPRESSION: Similar appearance of the chest with chronic lung changes and no
radiographic evidence of acute cardiopulmonary disease.

## 2021-11-08 ENCOUNTER — Other Ambulatory Visit: Payer: Self-pay

## 2021-11-08 ENCOUNTER — Encounter: Payer: Self-pay | Admitting: *Deleted

## 2021-11-08 DIAGNOSIS — J45909 Unspecified asthma, uncomplicated: Secondary | ICD-10-CM | POA: Insufficient documentation

## 2021-11-08 DIAGNOSIS — E119 Type 2 diabetes mellitus without complications: Secondary | ICD-10-CM | POA: Insufficient documentation

## 2021-11-08 DIAGNOSIS — I10 Essential (primary) hypertension: Secondary | ICD-10-CM | POA: Insufficient documentation

## 2021-11-08 DIAGNOSIS — M25572 Pain in left ankle and joints of left foot: Secondary | ICD-10-CM | POA: Insufficient documentation

## 2021-11-08 NOTE — ED Triage Notes (Signed)
Pt ambulatory to triage.  Pt has left ankle pain.  Pt states he woke up and ankle was hurting   no known injury.  Pt alert.

## 2021-11-09 ENCOUNTER — Emergency Department: Payer: Self-pay

## 2021-11-09 ENCOUNTER — Emergency Department
Admission: EM | Admit: 2021-11-09 | Discharge: 2021-11-09 | Disposition: A | Discharge status: Home / Self Care | Payer: Self-pay | Attending: Emergency Medicine | Admitting: Emergency Medicine

## 2021-11-09 DIAGNOSIS — M25572 Pain in left ankle and joints of left foot: Secondary | ICD-10-CM

## 2021-11-09 MED ORDER — KETOROLAC TROMETHAMINE 30 MG/ML IJ SOLN
30.0000 mg | Freq: Once | INTRAMUSCULAR | Status: AC
  Administered 2021-11-09: 30 mg via INTRAMUSCULAR
  Filled 2021-11-09: qty 1

## 2021-11-09 NOTE — ED Notes (Signed)
Pt received discharge papers and verbalized understanding of discharge instructions. Pt ambulates to exit with steady gait  

## 2021-11-09 NOTE — ED Provider Notes (Signed)
[facility] Provider Note    Event Date/Time   First MD Initiated Contact with Patient 11/09/21 [phone]     (approximate)   History   Chief Complaint Ankle Pain   HPI  Ryan Beck is a 41 y.o. male with past medical history of hypertension, diabetes, and asthma who presents to the ED complaining of ankle pain.  Patient reports that he woke up yesterday evening with pain diffusely in his left ankle.  He reports the pain has been worse if he attempts to bear weight on the left foot but denies any recent injuries to the ankle.  He has not noticed any redness or swelling, denies any fevers.     Physical Exam   Triage Vital Signs: ED Triage Vitals  Enc Vitals Group     BP 11/08/21 2345 (!) 139/90     Pulse Rate 11/08/21 2345 81     Resp 11/08/21 2345 20     Temp 11/08/21 2345 98.4 F (36.9 C)     Temp Source 11/08/21 2345 Oral     SpO2 11/08/21 2345 98 %     Weight 11/08/21 2344 252 lb (114.3 kg)     Height 11/08/21 2344 5\' 6"  (1.676 m)     Head Circumference --      Peak Flow --      Pain Score 11/08/21 2344 9     Pain Loc --      Pain Edu? --      Excl. in [place]? --     Most recent vital signs: Vitals:   11/08/21 2345  BP: (!) 139/90  Pulse: 81  Resp: 20  Temp: 98.4 F (36.9 C)  SpO2: 98%    Constitutional: Alert and oriented. Eyes: Conjunctivae are normal. Head: Atraumatic. Nose: No congestion/rhinnorhea. Mouth/Throat: Mucous membranes are moist.  Cardiovascular: Normal rate, regular rhythm. Grossly normal heart sounds.  2+ radial and DP pulses bilaterally. Respiratory: Normal respiratory effort.  No retractions. Lungs CTAB. Gastrointestinal: Soft and nontender. No distention. Musculoskeletal: Left ankle with diffuse tenderness to palpation and pain upon range of motion.  No erythema or warmth noted. Neurologic:  Normal speech and language. No gross focal neurologic deficits are appreciated.    ED Results / Procedures /  Treatments   Labs (all labs ordered are listed, but only abnormal results are displayed) Labs Reviewed - No data to display  RADIOLOGY Left ankle x-ray reviewed and interpreted by me with chronic deformity at the medial malleolus, no acute fracture or dislocation.  PROCEDURES:  Critical Care performed: No  Procedures   MEDICATIONS ORDERED IN ED: Medications  ketorolac (TORADOL) 30 MG/ML injection 30 mg (30 mg Intramuscular Given 11/09/21 0534)     IMPRESSION / MDM / ASSESSMENT AND PLAN / ED COURSE  I reviewed the triage vital signs and the nursing notes.                              40 y.o. male with past medical history of hypertension, diabetes, and asthma who presents to the ED complaining of atraumatic left ankle pain developing last night which is worse when he bears weight on his left leg.  Patient's presentation is most consistent with acute, uncomplicated illness.  Differential diagnosis includes, but is not limited to, fracture, dislocation, cellulitis, septic arthritis, DVT.  Patient nontoxic-appearing and in no acute distress, vital signs are unremarkable.  He is neurovascularly intact to his distal  left lower extremity, x-ray shows chronic deformities but no acute injuries noted.  No findings on exam to suggest DVT or cellulitis.  Patient is able to range his left ankle and I doubt septic arthritis.  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  He was counseled to return to the ED for new or worsening symptoms, patient agrees with plan.      FINAL CLINICAL IMPRESSION(S) / ED DIAGNOSES   Final diagnoses:  Acute left ankle pain     Rx / DC Orders   ED Discharge Orders     None        Note:  This document was prepared using Dragon voice recognition software and may include unintentional dictation errors.   Blake Divine, MD 11/09/21 [phone]

## 2021-12-03 ENCOUNTER — Other Ambulatory Visit: Payer: Self-pay

## 2021-12-03 ENCOUNTER — Emergency Department
Admission: EM | Admit: 2021-12-03 | Discharge: 2021-12-03 | Disposition: A | Discharge status: Home / Self Care | Payer: Self-pay | Attending: Emergency Medicine | Admitting: Emergency Medicine

## 2021-12-03 ENCOUNTER — Emergency Department: Payer: Self-pay

## 2021-12-03 DIAGNOSIS — E119 Type 2 diabetes mellitus without complications: Secondary | ICD-10-CM | POA: Insufficient documentation

## 2021-12-03 DIAGNOSIS — J4521 Mild intermittent asthma with (acute) exacerbation: Secondary | ICD-10-CM | POA: Insufficient documentation

## 2021-12-03 DIAGNOSIS — Z1152 Encounter for screening for COVID-19: Secondary | ICD-10-CM | POA: Insufficient documentation

## 2021-12-03 DIAGNOSIS — D72829 Elevated white blood cell count, unspecified: Secondary | ICD-10-CM | POA: Insufficient documentation

## 2021-12-03 LAB — CBC
HCT: 38.8 % — ABNORMAL LOW (ref 39.0–52.0)
Hemoglobin: 14 g/dL (ref 13.0–17.0)
MCH: 27.2 pg (ref 26.0–34.0)
MCHC: 36.1 g/dL — ABNORMAL HIGH (ref 30.0–36.0)
MCV: 75.3 fL — ABNORMAL LOW (ref 80.0–100.0)
Platelets: 325 10*3/uL (ref 150–400)
RBC: 5.15 MIL/uL (ref 4.22–5.81)
RDW: 13.3 % (ref 11.5–15.5)
WBC: 13.2 10*3/uL — ABNORMAL HIGH (ref 4.0–10.5)
nRBC: 0 % (ref 0.0–0.2)

## 2021-12-03 LAB — COMPREHENSIVE METABOLIC PANEL
ALT: 24 U/L (ref 0–44)
AST: 27 U/L (ref 15–41)
Albumin: 4 g/dL (ref 3.5–5.0)
Alkaline Phosphatase: 45 U/L (ref 38–126)
Anion gap: 6 (ref 5–15)
BUN: 16 mg/dL (ref 6–20)
CO2: 26 mmol/L (ref 22–32)
Calcium: 9.2 mg/dL (ref 8.9–10.3)
Chloride: 109 mmol/L (ref 98–111)
Creatinine, Ser: 0.94 mg/dL (ref 0.61–1.24)
GFR, Estimated: >60 mL/min (ref >60)
Glucose, Bld: 131 mg/dL — ABNORMAL HIGH (ref 70–99)
Potassium: 4.2 mmol/L (ref 3.5–5.1)
Sodium: 141 mmol/L (ref 135–145)
Total Bilirubin: 0.6 mg/dL (ref 0.3–1.2)
Total Protein: 7.7 g/dL (ref 6.5–8.1)

## 2021-12-03 LAB — RESP PANEL BY RT-PCR (FLU A&B, COVID) ARPGX2
Influenza A by PCR: NEGATIVE
Influenza B by PCR: NEGATIVE
SARS Coronavirus 2 by RT PCR: NEGATIVE

## 2021-12-03 LAB — BRAIN NATRIURETIC PEPTIDE: B Natriuretic Peptide: 22.5 pg/mL (ref 0.0–100.0)

## 2021-12-03 MED ORDER — ALBUTEROL SULFATE HFA 108 (90 BASE) MCG/ACT IN AERS: 2.0000 | INHALATION_SPRAY | RESPIRATORY_TRACT | Status: DC | PRN

## 2021-12-03 MED ORDER — ALBUTEROL SULFATE HFA 108 (90 BASE) MCG/ACT IN AERS: 2.0000 | INHALATION_SPRAY | Freq: Four times a day (QID) | RESPIRATORY_TRACT | 2 refills | Status: AC | PRN

## 2021-12-03 MED ORDER — IPRATROPIUM-ALBUTEROL 0.5-2.5 (3) MG/3ML IN SOLN
3.0000 mL | Freq: Once | RESPIRATORY_TRACT | Status: AC
  Administered 2021-12-03: 3 mL via RESPIRATORY_TRACT
  Filled 2021-12-03: qty 3

## 2021-12-03 MED ORDER — PREDNISONE 20 MG PO TABS
60.0000 mg | ORAL_TABLET | Freq: Once | ORAL | Status: AC
  Administered 2021-12-03: 60 mg via ORAL
  Filled 2021-12-03: qty 3

## 2021-12-03 MED ORDER — PREDNISONE 10 MG PO TABS: ORAL_TABLET | ORAL | 0 refills | Status: AC

## 2021-12-03 NOTE — ED Triage Notes (Signed)
Reports every time he goes into this house he is working on he starts having trouble breathing.  Patient has hx of asthma and reports he thinks he is exposed to mold and asbesto.  Patient falling asleep in triage while talking to him.

## 2021-12-03 NOTE — ED Provider Notes (Signed)
[facility] Provider Note    Event Date/Time   First MD Initiated Contact with Patient 12/03/21 1749     (approximate)   History   Chief Complaint Asthma   HPI Ryan Beck is a 41 y.o. male, history of asthma, morbid obesity, OSA, type 2 diabetes, presents to the emergency department for evaluation of shortness of breath x1 week.  He states that he is currently trying to purchase a house, however he believes that there is mold/asbestos in the house which may be causing his asthma to flare.  He has been going in and out of the house frequently over the past week.  He is not currently on any medications for his asthma at this time.  Denies fever/chills, myalgias, chest pain, abdominal pain, flank pain, nausea/vomiting, diarrhea, dysuria, weakness, rash/lesions, or dizziness/lightheadedness.  History Limitations: No limitations.        Physical Exam  Triage Vital Signs: ED Triage Vitals [12/03/21 1556]  Enc Vitals Group     BP 127/88     Pulse Rate 77     Resp 18     Temp 98.5 F (36.9 C)     Temp Source Oral     SpO2 92 %     Weight 252 lb (114.3 kg)     Height 5\' 6"  (1.676 m)     Head Circumference      Peak Flow      Pain Score 0     Pain Loc      Pain Edu?      Excl. in GC?     Most recent vital signs: Vitals:   12/03/21 1915 12/03/21 2106  BP: 127/80 132/77  Pulse: 98 87  Resp: 18 19  Temp: 97.9 F (36.6 C)   SpO2: 96% 97%    General: Awake, NAD.  Audible coughing in the room. Skin: Warm, dry. No rashes or lesions.  Eyes: PERRL. Conjunctivae normal.  CV: Good peripheral perfusion.  Resp: Normal effort.  Diffuse moderate wheezing in the apices and bases. Abd: Soft, non-tender. No distention.  Neuro: At baseline. No gross neurological deficits.  Musculoskeletal: Normal ROM of all extremities.   Physical Exam    ED Results / Procedures / Treatments  Labs (all labs ordered are listed, but only abnormal results are  displayed) Labs Reviewed  COMPREHENSIVE METABOLIC PANEL - Abnormal; Notable for the following components:      Result Value   Glucose, Bld 131 (*)    All other components within normal limits  CBC - Abnormal; Notable for the following components:   WBC 13.2 (*)    HCT 38.8 (*)    MCV 75.3 (*)    MCHC 36.1 (*)    All other components within normal limits  RESP PANEL BY RT-PCR (FLU A&B, COVID) ARPGX2  BRAIN NATRIURETIC PEPTIDE     EKG N/A.    RADIOLOGY  ED Provider Interpretation: I personally reviewed and interpreted this x-ray, no evidence of infection.  DG Chest 2 View  Result Date: 12/03/2021 CLINICAL DATA:  Shortness of breath, wheezing EXAM: CHEST - 2 VIEW COMPARISON:  09/16/2019 FINDINGS: The heart size and mediastinal contours are within normal limits. Both lungs are clear. The visualized skeletal structures are unremarkable. IMPRESSION: No active cardiopulmonary disease. Electronically Signed   By: 09/18/2019 M.D.   On: 12/03/2021 16:42    PROCEDURES:  Critical Care performed: N/A.  Procedures    MEDICATIONS ORDERED IN ED: Medications  predniSONE (DELTASONE)  tablet 60 mg (60 mg Oral Given 12/03/21 1914)  ipratropium-albuterol (DUONEB) 0.5-2.5 (3) MG/3ML nebulizer solution 3 mL (3 mLs Nebulization Given 12/03/21 1914)  ipratropium-albuterol (DUONEB) 0.5-2.5 (3) MG/3ML nebulizer solution 3 mL (3 mLs Nebulization Given 12/03/21 1955)     IMPRESSION / MDM / ASSESSMENT AND PLAN / ED COURSE  I reviewed the triage vital signs and the nursing notes.                              Differential diagnosis includes, but is not limited to, asthma exacerbation, COVID-19, influenza, community-acquired pneumonia, COPD, mold exposure.  ED Course Patient appears well, though does have diffuse wheezing in the apices and bases bilaterally.  Will initiate DuoNeb and prednisone.  CBC shows leukocytosis with WBC of 13.2.  No anemia present.  CMP shows no electrolyte  abnormalities, transaminitis, or AKI.  Respiratory panel negative for COVID-19 or influenza.  BMP unremarkable at 22.5.  Assessment/Plan Presentation consistent with asthma exacerbation, possibly secondary to irritation from suspected mold exposure.  Chest x-ray does not show any lesions or evidence of pneumonia at this time.  Lab workup is reassuring.  Respiratory panel negative for COVID-19 or influenza.  He appears well clinically, though he does have some mild to moderate wheezing bilaterally on exam.  Treated here with 3 DuoNeb treatments, as well as a single dose of prednisone.  On reexamination following treatment, he states that he is breathing much better.  Will provide him with a refill on his albuterol inhaler, as well as a prednisone taper.  Recommend that he follow-up with his regular doctor for long-term management of his asthma.   Considered admission for this patient, but given his stable presentation and positive response to treatment, he is unlikely to benefit from admission.  Provided the patient with anticipatory guidance, return precautions, and educational material. Encouraged the patient to return to the emergency department at any time if they begin to experience any new or worsening symptoms. Patient expressed understanding and agreed with the plan.   Patient's presentation is most consistent with acute complicated illness / injury requiring diagnostic workup.       FINAL CLINICAL IMPRESSION(S) / ED DIAGNOSES   Final diagnoses:  Mild intermittent asthma with exacerbation     Rx / DC Orders   ED Discharge Orders          Ordered    albuterol (VENTOLIN HFA) 108 (90 Base) MCG/ACT inhaler  Every 6 hours PRN        12/03/21 2044    predniSONE (DELTASONE) 10 MG tablet  Q breakfast        12/03/21 2044             Note:  This document was prepared using Dragon voice recognition software and may include unintentional dictation errors.   Varney Daily, [place] 12/03/21 2258    Corena Herter, MD 12/03/21 2303

## 2021-12-03 NOTE — ED Provider Triage Note (Signed)
Emergency Medicine Provider Triage Evaluation Note  Ryan Beck , a 41 y.o. male  was evaluated in triage.  Pt complains of shortness of breath and increased work of breathing.  Patient history of asthma.  He is also concerned that he may have been exposed to asbestos.  No chest pain or abdominal pain.  Review of Systems  Positive: Patient has cough and shortness of breath. Negative: No chest pain or abdominal pain.   Physical Exam  BP 127/88 (BP Location: Right Arm)   Pulse 77   Temp 98.5 F (36.9 C) (Oral)   Resp 18   Ht 5\' 6"  (1.676 m)   Wt 114.3 kg   SpO2 92%   BMI 40.67 kg/m  Gen:   Awake, no distress   Resp:  Normal effort  MSK:   Moves extremities without difficulty  Other:    Medical Decision Making  Medically screening exam initiated at 4:27 PM.  Appropriate orders placed.  Ryan Beck was informed that the remainder of the evaluation will be completed by another provider, this initial triage assessment does not replace that evaluation, and the importance of remaining in the ED until their evaluation is complete.     Ryan Beck [place], [name] 12/03/21 1628

## 2021-12-03 NOTE — Discharge Instructions (Addendum)
-  Please take the full course of the prednisone taper as prescribed.  You may additionally utilize your albuterol inhaler as needed.  -Please follow-up with your primary care provider for long-term management.  -Avoid any unnecessary exposures to the suspected house.  -Return to the emergency department anytime if you begin to experience any new or worsening symptoms.

## 2022-05-04 ENCOUNTER — Encounter: Payer: Self-pay | Admitting: Emergency Medicine

## 2022-05-04 ENCOUNTER — Emergency Department: Payer: 59

## 2022-05-04 DIAGNOSIS — M549 Dorsalgia, unspecified: Secondary | ICD-10-CM | POA: Insufficient documentation

## 2022-05-04 DIAGNOSIS — M545 Low back pain, unspecified: Secondary | ICD-10-CM | POA: Diagnosis not present

## 2022-05-04 NOTE — ED Triage Notes (Signed)
Pt presents ambulatory to triage via POV with complaints of mid to lower back pain that started this AM. Rates the pain 10/10 - no meds taken PTA. He notes no injury or heavy lifting. A&Ox4 at this time. Denies urinary sx, CP or SOB.

## 2022-05-05 ENCOUNTER — Other Ambulatory Visit: Payer: Self-pay

## 2022-05-05 ENCOUNTER — Emergency Department
Admission: EM | Admit: 2022-05-05 | Discharge: 2022-05-05 | Disposition: A | Discharge status: Home / Self Care | Payer: 59 | Attending: Emergency Medicine | Admitting: Emergency Medicine

## 2022-05-05 DIAGNOSIS — M549 Dorsalgia, unspecified: Secondary | ICD-10-CM

## 2022-05-05 MED ORDER — CYCLOBENZAPRINE HCL 10 MG PO TABS
5.0000 mg | ORAL_TABLET | Freq: Once | ORAL | Status: AC
  Administered 2022-05-05: 5 mg via ORAL
  Filled 2022-05-05: qty 1

## 2022-05-05 MED ORDER — CYCLOBENZAPRINE HCL 5 MG PO TABS: 5.0000 mg | ORAL_TABLET | Freq: Three times a day (TID) | ORAL | 0 refills | Status: AC | PRN

## 2022-05-05 MED ORDER — IBUPROFEN 600 MG PO TABS
600.0000 mg | ORAL_TABLET | Freq: Once | ORAL | Status: AC
  Administered 2022-05-05: 600 mg via ORAL
  Filled 2022-05-05: qty 1

## 2022-05-05 NOTE — Discharge Instructions (Signed)
You were evaluated in the Emergency Department today for back pain. Your evaluation suggests no acute abnormalities which require further intervention at this time.   - Move around as tolerated but avoiding heavy lifting. "Bed rest" is not recommended nor is it the best treatment for low back pain.  - Medications will help control your discomfort: -- Ibuprofen (600 mg every 8 hours for pain). -- Any other prescriptions you were provided as per label instructions  -- Do not drink alcohol, drive a car, operate machinery, or get up on ladders or heights when taking any prescribed pain medications.  -- Do not drive home if you received prescribed pain medications here in the ED.  Please follow up with your primary care physician as needed or any other providers listed in this paperwork. If you do not have a primary doctor, you can call your insurance company to find one.  If you do not have insurance, you can go to the finance/registration department for more assistance.  Return to the ED immediately if you develop any of the following problems: -- Leaking urine or difficulty urinating; -- Inability to control your bowels; -- New numbness or weakness in your legs or numbness between your legs; -- Inability to walk -- Fever  As we discussed, you should establish a primary care provider with whom you can follow-up in clinic.  Please refer to the included resource guide on local primary care providers; call 1 or more of the clinics that would be convenient for you and explained to them that you need to establish a care provider and they should be able to help you.

## 2022-05-05 NOTE — ED Provider Notes (Signed)
[facility] Provider Note    Event Date/Time   First MD Initiated Contact with Patient 05/05/22 0125     (approximate)   History   Back Pain   HPI Ryan Beck is a 42 y.o. male who presents for evaluation of acute onset generalized back pain about 24 hours ago.  He said that he woke up and his back was hurting him.  He feels like it cramps or spasms from time to time.  It is mostly in the middle of his back on both sides but sometimes radiates down "into my butt".  It does not radiate down into his legs.  It seems to be a little bit worse if he bends over or gets in certain positions.  He is able to walk without difficulty although it hurts to do so.  He did not have any injuries or muscle strains of which she is aware.  He has no numbness or tingling in his extremities.  He has had no difficulty holding his bladder and no difficulty initiating urination.  No bowel habit changes.  No recent fevers.  No known injuries.  No hematuria or dysuria and the pain is not localized to any 1 particular location.  He did not take any medications at home because he said he does not like medication.     Physical Exam   Triage Vital Signs: ED Triage Vitals  Enc Vitals Group     BP 05/04/22 2222 110/65     Pulse Rate 05/04/22 2222 81     Resp 05/04/22 2222 18     Temp 05/04/22 2222 98.4 F (36.9 C)     Temp Source 05/04/22 2222 Oral     SpO2 05/04/22 2222 94 %     Weight 05/04/22 2228 116.2 kg (256 lb 2.8 oz)     Height 05/04/22 2228 1.676 m (5\' 6" )     Head Circumference --      Peak Flow --      Pain Score 05/04/22 2228 10     Pain Loc --      Pain Edu? --      Excl. in GC? --     Most recent vital signs: Vitals:   05/04/22 2222 05/05/22 0140  BP: 110/65 99/62  Pulse: 81 (!) 56  Resp: 18 19  Temp: 98.4 F (36.9 C) 97.7 F (36.5 C)  SpO2: 94% 95%    General: Awake, no distress.  Sleeping comfortably when I checked on him. CV:  Good peripheral  perfusion.  Resp:  Normal effort. Speaking easily and comfortably, no accessory muscle usage nor intercostal retractions.   Abd:  No distention.  No tenderness to palpation of the abdomen.  No guarding.  Morbid obesity. Other:  Patient reports generalized discomfort/tenderness when I palpate anywhere in the soft tissues and paraspinal muscles of his back, from the thoracic region down to the pelvis through the lumbar region and sacrum.  He has no specific point tenderness to palpation along the spine.  There are no palpable deformities, induration, nor abscesses.  No visible abnormalities.   ED Results / Procedures / Treatments   Labs (all labs ordered are listed, but only abnormal results are displayed) Labs Reviewed - No data to display    RADIOLOGY I viewed and interpreted the patient's lumbar spine x-rays and I see no evidence of fracture or dislocation.  I also read the radiologist's report, which confirmed no acute findings.   PROCEDURES:  Critical  Care performed: No  Procedures    IMPRESSION / MDM / ASSESSMENT AND PLAN / ED COURSE  I reviewed the triage vital signs and the nursing notes.                              Differential diagnosis includes but is not exclusive to musculoskeletal back pain, renal colic, urinary tract infection, pyelonephritis, intra-abdominal causes of back pain, aortic aneurysm or dissection, cauda equina syndrome, sciatica, lumbar disc disease, thoracic disc disease, etc.   Patient's presentation is most consistent with acute complicated illness / injury requiring diagnostic workup.  Labs/studies ordered: Lumbar x-rays  Interventions/Medications given:  Medications  cyclobenzaprine (FLEXERIL) tablet 5 mg (5 mg Oral Given 05/05/22 0232)  ibuprofen (ADVIL) tablet 600 mg (600 mg Oral Given 05/05/22 0232)    (Note:  hospital course my include additional interventions and/or labs/studies not listed above.)   Vital signs stable within normal  limits.  Patient was sleeping comfortably when I checked on him.  No specific known trauma, but also no warning signs of any emergent medical condition.  Symptoms seem very generalized musculoskeletal strain in the back as there is no specific localized region but it is confined to his back.  No indication for additional evaluation or imaging, no warning signs to necessitate MRI or other emergent imaging other than the x-rays which were reassuring.  I ordered medications as listed above and a prescription for Flexeril and I recommended the use of over-the-counter ibuprofen.  I encouraged outpatient follow-up with a primary care doctor and I gave my usual back pain return precautions.  Patient agrees with the plan.         FINAL CLINICAL IMPRESSION(S) / ED DIAGNOSES   Final diagnoses:  Acute bilateral back pain, unspecified back location     Rx / DC Orders   ED Discharge Orders          Ordered    cyclobenzaprine (FLEXERIL) 5 MG tablet  3 times daily PRN        05/05/22 0325             Note:  This document was prepared using Dragon voice recognition software and may include unintentional dictation errors.   Loleta Rose, MD 05/05/22 0330

## 2022-05-18 ENCOUNTER — Emergency Department
Admission: EM | Admit: 2022-05-18 | Discharge: 2022-05-19 | Disposition: A | Discharge status: Home / Self Care | Payer: 59 | Attending: Emergency Medicine | Admitting: Emergency Medicine

## 2022-05-18 ENCOUNTER — Emergency Department: Payer: 59

## 2022-05-18 ENCOUNTER — Encounter: Payer: Self-pay | Admitting: Emergency Medicine

## 2022-05-18 DIAGNOSIS — R109 Unspecified abdominal pain: Secondary | ICD-10-CM | POA: Insufficient documentation

## 2022-05-18 DIAGNOSIS — M546 Pain in thoracic spine: Secondary | ICD-10-CM | POA: Insufficient documentation

## 2022-05-18 DIAGNOSIS — K5730 Diverticulosis of large intestine without perforation or abscess without bleeding: Secondary | ICD-10-CM | POA: Diagnosis not present

## 2022-05-18 DIAGNOSIS — J45909 Unspecified asthma, uncomplicated: Secondary | ICD-10-CM | POA: Diagnosis not present

## 2022-05-18 DIAGNOSIS — M545 Low back pain, unspecified: Secondary | ICD-10-CM | POA: Diagnosis not present

## 2022-05-18 DIAGNOSIS — E8809 Other disorders of plasma-protein metabolism, not elsewhere classified: Secondary | ICD-10-CM | POA: Diagnosis not present

## 2022-05-18 DIAGNOSIS — M549 Dorsalgia, unspecified: Secondary | ICD-10-CM

## 2022-05-18 LAB — URINALYSIS, ROUTINE W REFLEX MICROSCOPIC
Bacteria, UA: NONE SEEN
Bilirubin Urine: NEGATIVE
Glucose, UA: NEGATIVE mg/dL
Hgb urine dipstick: NEGATIVE
Ketones, ur: NEGATIVE mg/dL
Leukocytes,Ua: NEGATIVE
Nitrite: NEGATIVE
Protein, ur: 30 mg/dL — AB
Specific Gravity, Urine: 1.032 — ABNORMAL HIGH (ref 1.005–1.030)
pH: 5 (ref 5.0–8.0)

## 2022-05-18 MED ORDER — KETOROLAC TROMETHAMINE 15 MG/ML IJ SOLN
15.0000 mg | Freq: Once | INTRAMUSCULAR | Status: AC
  Administered 2022-05-18: 15 mg via INTRAVENOUS
  Filled 2022-05-18: qty 1

## 2022-05-18 NOTE — ED Notes (Signed)
Pt to CT

## 2022-05-18 NOTE — ED Provider Notes (Signed)
[facility] Emergency Department Provider Note     Event Date/Time   First MD Initiated Contact with Patient 05/18/22 2240     (approximate)   History   Back Pain   HPI  Ryan Beck is a 42 y.o. male with a past medical history of asthma who presents to the ED for recurrent back pain.  Patient reports moderate intermittent mid back pain  x 3 weeks with an increase of pain today.  He denies radiation.  Patient reports he was at work today and could not complete his workday due to this pain.  He has tried ibuprofen with no relief.  Associated symptoms include abdominal pain.  Patient reports " my back hurts so bad it is making my stomach hurt ". He denies trauma, injury, or fall.  Denies urinary symptoms at this time. No history of kidney stone.  Denies numbness, tingling, loss of bladder and bowel control.  Physical Exam   Triage Vital Signs: ED Triage Vitals  Enc Vitals Group     BP 05/18/22 2109 114/72     Pulse Rate 05/18/22 2109 68     Resp 05/18/22 2109 18     Temp 05/18/22 2109 98.6 F (37 C)     Temp src --      SpO2 05/18/22 2109 96 %     Weight 05/18/22 2108 256 lb (116.1 kg)     Height 05/18/22 2108 5\' 6"  (1.676 m)     Head Circumference --      Peak Flow --      Pain Score --      Pain Loc --      Pain Edu? --      Excl. in GC? --     Most recent vital signs: Vitals:   05/18/22 2109 05/19/22 0009  BP: 114/72 112/66  Pulse: 68 62  Resp: 18 18  Temp: 98.6 F (37 C) 98.3 F (36.8 C)  SpO2: 96% 97%   General: Alert and oriented.  uncomfortable.    Head:  NCAT.  Eyes:  PERRLA. EOMI. Conjunctivae clear. Neck:   Supple. No cervical spine tenderness to palpation.  CV:  Good peripheral perfusion. RRR.  RESP:  Normal effort. LCTAB.  ABD:  No distention. Soft.  Nontender in all quadrants. + CVA tenderness.  BACK:  Thoracic paraspinal tenderness upon palpation.  Spinous process is midline without deformity. MSK:   Patient  freely moves all extremities.   NEURO: Cranial nerves II-XII intact. No focal deficits. Sensation and motor function intact.       ED Results / Procedures / Treatments   Labs (all labs ordered are listed, but only abnormal results are displayed) Labs Reviewed  URINALYSIS, ROUTINE W REFLEX MICROSCOPIC - Abnormal; Notable for the following components:      Result Value   Color, Urine YELLOW (*)    APPearance HAZY (*)    Specific Gravity, Urine 1.032 (*)    Protein, ur 30 (*)    All other components within normal limits    RADIOLOGY  I personally viewed and evaluated these images as part of my medical decision making, as well as reviewing the written report by the radiologist.  ED Provider Interpretation: Renal stone study for evidence of a stone is unremarkable.  CT Renal Stone Study  Result Date: 05/19/2022 CLINICAL DATA:  Abdominal and flank pain. EXAM: CT ABDOMEN AND PELVIS WITHOUT CONTRAST TECHNIQUE: Multidetector CT imaging of the abdomen and pelvis was performed  following the standard protocol without IV contrast. RADIATION DOSE REDUCTION: This exam was performed according to the departmental dose-optimization program which includes automated exposure control, adjustment of the mA and/or kV according to patient size and/or use of iterative reconstruction technique. COMPARISON:  None. FINDINGS: Lower chest: No acute abnormality. Hepatobiliary: No focal liver abnormality is seen. No gallstones, gallbladder wall thickening, or biliary dilatation. Pancreas: Unremarkable. No pancreatic ductal dilatation or surrounding inflammatory changes. Spleen: Normal in size without focal abnormality. Adrenals/Urinary Tract: Adrenal glands are unremarkable. Kidneys are normal, without renal calculi, focal lesion, or hydronephrosis. Bladder is unremarkable. Stomach/Bowel: Stomach is within normal limits. Appendix appears normal. No evidence of bowel wall thickening, distention, or inflammatory changes.  There is sigmoid colon diverticulosis. Vascular/Lymphatic: No significant vascular findings are present. No enlarged abdominal or pelvic lymph nodes. Reproductive: Prostate is unremarkable. Other: There are prominent bilateral inguinal lymph nodes measuring up to 10 mm. There is subcutaneous edema in the left inguinal region with 2.9 x 2.1 x 2.9 cm low-attenuation collection just beneath the skin surface. There is no evidence for soft tissue gas or foreign body. There is no evidence for inguinal hernia. There is a small fat containing umbilical hernia. There is no ascites. Musculoskeletal: No acute or significant osseous findings. IMPRESSION: 1. Subcutaneous edema in the left inguinal region with 2.9 x 2.1 x 2.9 cm low-attenuation collection just beneath the skin surface. Findings are compatible with cellulitis with likely abscess. 2. No evidence for inguinal hernia. 3. Prominent bilateral inguinal lymph nodes are likely reactive. 4. Sigmoid colon diverticulosis. Electronically Signed   By: Darliss Cheney M.D.   On: 05/19/2022 00:01     PROCEDURES:  Critical Care performed: No  Procedures   MEDICATIONS ORDERED IN ED: Medications  ketorolac (TORADOL) 15 MG/ML injection 15 mg (15 mg Intravenous Given 05/18/22 2327)    IMPRESSION / MDM / ASSESSMENT AND PLAN / ED COURSE  I reviewed the triage vital signs and the nursing notes.                              Differential diagnosis includes, but is not limited to, musculoskeletal pain, chronic back pain, injury, kidney stone, sciatica  Patient's presentation is most consistent with acute complicated illness / injury requiring diagnostic workup.  42 year old male presents to the emergency department with acute onset of back pain.  He was seen 3 weeks ago with similar complaints.  Symptoms since previous visit resolved completely prior to new onset.  Primary assessment is reassuring.  Vital signs stable.  Patient is more concerned of returning to work  tomorrow.  My suspicion for renal stone is likely.  I will obtain a urinalysis and a CT renal study to rule out suspicion.  He will be given Toradol for pain.  Current pain is 10/10.  CT renal study is reassuring.  No kidney stone.  Toradol improved patient's pain level.  He reported he is feeling better, pain is 6/10 not completely resolved.  However I believe he can be further evaluated with a outpatient follow-up.  Patient's diagnosis is consistent with unspecified back pain. Patient will be discharged home with prescriptions for short dose of Toradol p.o. and Flexeril. Patient is to follow up with [facility] clinic as needed or otherwise directed. Patient is given ED precautions to return to the ED for any worsening or new symptoms.  Patient verbalizes understanding and agrees with assessment and plan.  Clinical Course as of  05/19/22 1315  Wed May 18, 2022  2309 Urinalysis, Routine w reflex microscopic -Urine, Clean Catch(!) Mildly elevated USG and elevated protein clinically consistent with dehydration.  Absence of hematuria.  Reassuring [MH]    Clinical Course User Index [MH] Romeo Apple, Rosetta Rupnow A, PA-C    FINAL CLINICAL IMPRESSION(S) / ED DIAGNOSES   Final diagnoses:  Back pain, unspecified back location, unspecified back pain laterality, unspecified chronicity     Rx / DC Orders   ED Discharge Orders          Ordered    ketorolac (TORADOL) 10 MG tablet  Every 6 hours PRN        05/19/22 0005    cyclobenzaprine (FLEXERIL) 10 MG tablet  3 times daily PRN        05/19/22 0005             Note:  This document was prepared using Dragon voice recognition software and may include unintentional dictation errors.    Romeo Apple, Jaleah Lefevre A, PA-C 05/19/22 1315    Shaune Pollack, MD 05/20/22 [phone]

## 2022-05-18 NOTE — ED Notes (Signed)
No answer when called several times from lobby 

## 2022-05-18 NOTE — ED Triage Notes (Signed)
Pt presents ambulatory to triage via POV with complaints of back pain that started today. Pt was seen here 3 weeks ago for same and the pain resolved but came back today. He notes taking Motrin PTA which hasn't changed his pain level, A&Ox4 at this time. Denies falls, injury, strenuous activity, CP, AP, or SOB.

## 2022-05-19 MED ORDER — CYCLOBENZAPRINE HCL 10 MG PO TABS: 10.0000 mg | ORAL_TABLET | Freq: Three times a day (TID) | ORAL | 0 refills | Status: AC | PRN

## 2022-05-19 MED ORDER — KETOROLAC TROMETHAMINE 10 MG PO TABS: 10.0000 mg | ORAL_TABLET | Freq: Four times a day (QID) | ORAL | 0 refills | Status: AC | PRN

## 2023-01-07 ENCOUNTER — Emergency Department
Admission: EM | Admit: 2023-01-07 | Discharge: 2023-01-07 | Disposition: A | Discharge status: Home / Self Care | Payer: 59 | Attending: Emergency Medicine | Admitting: Emergency Medicine

## 2023-01-07 ENCOUNTER — Other Ambulatory Visit: Payer: Self-pay

## 2023-01-07 DIAGNOSIS — E1142 Type 2 diabetes mellitus with diabetic polyneuropathy: Secondary | ICD-10-CM | POA: Diagnosis not present

## 2023-01-07 DIAGNOSIS — M79672 Pain in left foot: Secondary | ICD-10-CM | POA: Diagnosis not present

## 2023-01-07 DIAGNOSIS — M79671 Pain in right foot: Secondary | ICD-10-CM | POA: Diagnosis not present

## 2023-01-07 DIAGNOSIS — M79604 Pain in right leg: Secondary | ICD-10-CM

## 2023-01-07 DIAGNOSIS — G629 Polyneuropathy, unspecified: Secondary | ICD-10-CM | POA: Diagnosis not present

## 2023-01-07 DIAGNOSIS — R600 Localized edema: Secondary | ICD-10-CM | POA: Diagnosis not present

## 2023-01-07 DIAGNOSIS — E1140 Type 2 diabetes mellitus with diabetic neuropathy, unspecified: Secondary | ICD-10-CM | POA: Diagnosis not present

## 2023-01-07 LAB — COMPREHENSIVE METABOLIC PANEL
ALT: 29 U/L (ref 0–44)
AST: 29 U/L (ref 15–41)
Albumin: 4.4 g/dL (ref 3.5–5.0)
Alkaline Phosphatase: 57 U/L (ref 38–126)
Anion gap: 10 (ref 5–15)
BUN: 22 mg/dL — ABNORMAL HIGH (ref 6–20)
CO2: 24 mmol/L (ref 22–32)
Calcium: 9 mg/dL (ref 8.9–10.3)
Chloride: 105 mmol/L (ref 98–111)
Creatinine, Ser: 1.02 mg/dL (ref 0.61–1.24)
GFR, Estimated: >60 mL/min (ref >60)
Glucose, Bld: 150 mg/dL — ABNORMAL HIGH (ref 70–99)
Potassium: 4.2 mmol/L (ref 3.5–5.1)
Sodium: 139 mmol/L (ref 135–145)
Total Bilirubin: 0.4 mg/dL (ref 0.0–1.2)
Total Protein: 8.2 g/dL — ABNORMAL HIGH (ref 6.5–8.1)

## 2023-01-07 LAB — CBC WITH DIFFERENTIAL/PLATELET
Abs Immature Granulocytes: 0.05 10*3/uL (ref 0.00–0.07)
Basophils Absolute: 0.1 10*3/uL (ref 0.0–0.1)
Basophils Relative: 1 %
Eosinophils Absolute: 0.5 10*3/uL (ref 0.0–0.5)
Eosinophils Relative: 3 %
HCT: 40.2 % (ref 39.0–52.0)
Hemoglobin: 14.4 g/dL (ref 13.0–17.0)
Immature Granulocytes: 0 %
Lymphocytes Relative: 33 %
Lymphs Abs: 5.2 10*3/uL — ABNORMAL HIGH (ref 0.7–4.0)
MCH: 27.4 pg (ref 26.0–34.0)
MCHC: 35.8 g/dL (ref 30.0–36.0)
MCV: 76.6 fL — ABNORMAL LOW (ref 80.0–100.0)
Monocytes Absolute: 0.8 10*3/uL (ref 0.1–1.0)
Monocytes Relative: 5 %
Neutro Abs: 8.9 10*3/uL — ABNORMAL HIGH (ref 1.7–7.7)
Neutrophils Relative %: 58 %
Platelets: 338 10*3/uL (ref 150–400)
RBC: 5.25 MIL/uL (ref 4.22–5.81)
RDW: 13.9 % (ref 11.5–15.5)
WBC: 15.5 10*3/uL — ABNORMAL HIGH (ref 4.0–10.5)
nRBC: 0 % (ref 0.0–0.2)

## 2023-01-07 LAB — BRAIN NATRIURETIC PEPTIDE: B Natriuretic Peptide: 10.2 pg/mL (ref 0.0–100.0)

## 2023-01-07 MED ORDER — GABAPENTIN 100 MG PO CAPS: 100.0000 mg | ORAL_CAPSULE | Freq: Three times a day (TID) | ORAL | 2 refills | Status: AC | PRN

## 2023-01-07 MED ORDER — GLIPIZIDE ER 5 MG PO TB24: 5.0000 mg | ORAL_TABLET | Freq: Every day | ORAL | 0 refills | Status: AC

## 2023-01-07 MED ORDER — METFORMIN HCL 500 MG PO TABS: 500.0000 mg | ORAL_TABLET | Freq: Two times a day (BID) | ORAL | 0 refills | Status: AC

## 2023-01-07 NOTE — ED Triage Notes (Addendum)
 Patient states yesterday he woke up with bilateral ankle pain and swelling, denies CHF, chest pain and shortness of breath. No injury and/or trauma.

## 2023-01-07 NOTE — ED Provider Notes (Signed)
 [facility] Provider Note   Event Date/Time   First MD Initiated Contact with Patient 01/07/23 [phone]     (approximate) History  Leg Swelling  HPI Ryan Beck is a 43 y.o. male with a stated past medical history of type 2 diabetes who presents complaining of morning foot pain bilaterally that has been present over the last week.  Patient states that he has what he feels is swelling as well as occasional pins-and-needles to bilateral soles of the feet that has been worse in the mornings and gets somewhat better throughout the day.  Patient denies any change in shoes, change in sleep habits, or any trauma ROS: Patient currently denies any vision changes, tinnitus, difficulty speaking, facial droop, sore throat, chest pain, shortness of breath, abdominal pain, nausea/vomiting/diarrhea, dysuria, or weakness in any extremity   Physical Exam  Triage Vital Signs: ED Triage Vitals  Encounter Vitals Group     BP 01/07/23 0751 133/72     Systolic BP Percentile --      Diastolic BP Percentile --      Pulse Rate 01/07/23 0751 88     Resp 01/07/23 0751 18     Temp 01/07/23 0751 98 F (36.7 C)     Temp Source 01/07/23 0751 Oral     SpO2 01/07/23 0751 97 %     Weight 01/07/23 0752 253 lb (114.8 kg)     Height 01/07/23 0752 5' 6 (1.676 m)     Head Circumference --      Peak Flow --      Pain Score 01/07/23 0754 7     Pain Loc --      Pain Education --      Exclude from Growth Chart --    Most recent vital signs: Vitals:   01/07/23 0751  BP: 133/72  Pulse: 88  Resp: 18  Temp: 98 F (36.7 C)  SpO2: 97%   General: Awake, oriented x4. CV:  Good peripheral perfusion.  Resp:  Normal effort.  Abd:  No distention.  Other:  Middle-aged obese African-American male resting comfortably in no acute distress.  Decreased sensation to soles of bilateral feet ED Results / Procedures / Treatments  Labs (all labs ordered are listed, but only abnormal results are  displayed) Labs Reviewed  CBC WITH DIFFERENTIAL/PLATELET - Abnormal; Notable for the following components:      Result Value   WBC 15.5 (*)    MCV 76.6 (*)    Neutro Abs 8.9 (*)    Lymphs Abs 5.2 (*)    All other components within normal limits  COMPREHENSIVE METABOLIC PANEL - Abnormal; Notable for the following components:   Glucose, Bld 150 (*)    BUN 22 (*)    Total Protein 8.2 (*)    All other components within normal limits  BRAIN NATRIURETIC PEPTIDE   PROCEDURES: Critical Care performed: No Procedures MEDICATIONS ORDERED IN ED: Medications - No data to display IMPRESSION / MDM / ASSESSMENT AND PLAN / ED COURSE  I reviewed the triage vital signs and the nursing notes.                             The patient is on the cardiac monitor to evaluate for evidence of arrhythmia and/or significant heart rate changes. Patient's presentation is most consistent with acute presentation with potential threat to life or bodily function. Presents with sensation of pain and tingling to  bilateral feet.  Patient's symptoms and work-up not consistent with a stroke and therefore they will be discharged from the ED.  Patient has currently been stabilized in the emergency department.  Patient's symptoms not typical for other emergent causes such as dissection, infection, DKA, trauma. Patient will be discharged with strict return precautions and follow up with primary MD within 24 hours for further evaluation.   FINAL CLINICAL IMPRESSION(S) / ED DIAGNOSES   Final diagnoses:  Bilateral lower extremity pain  Diabetic polyneuropathy associated with type 2 diabetes mellitus (HCC)   Rx / DC Orders   ED Discharge Orders          Ordered    Ambulatory Referral to Primary Care (Establish Care)        01/07/23 0956    glipiZIDE  (GLUCOTROL  XL) 5 MG 24 hr tablet  Daily with breakfast        01/07/23 1003    metFORMIN  (GLUCOPHAGE ) 500 MG tablet  2 times daily with meals        01/07/23 1003     gabapentin  (NEURONTIN ) 100 MG capsule  3 times daily PRN        01/07/23 1003           Note:  This document was prepared using Dragon voice recognition software and may include unintentional dictation errors.   Izabellah Dadisman K, MD 01/07/23 1005

## 2023-01-23 ENCOUNTER — Emergency Department
Admission: EM | Admit: 2023-01-23 | Discharge: 2023-01-23 | Disposition: A | Discharge status: Home / Self Care | Payer: 59 | Attending: Emergency Medicine | Admitting: Emergency Medicine

## 2023-01-23 ENCOUNTER — Other Ambulatory Visit: Payer: Self-pay

## 2023-01-23 DIAGNOSIS — J45909 Unspecified asthma, uncomplicated: Secondary | ICD-10-CM | POA: Insufficient documentation

## 2023-01-23 DIAGNOSIS — Z20822 Contact with and (suspected) exposure to covid-19: Secondary | ICD-10-CM | POA: Insufficient documentation

## 2023-01-23 DIAGNOSIS — R197 Diarrhea, unspecified: Secondary | ICD-10-CM | POA: Diagnosis not present

## 2023-01-23 DIAGNOSIS — A084 Viral intestinal infection, unspecified: Secondary | ICD-10-CM | POA: Insufficient documentation

## 2023-01-23 DIAGNOSIS — R1110 Vomiting, unspecified: Secondary | ICD-10-CM | POA: Diagnosis not present

## 2023-01-23 DIAGNOSIS — R112 Nausea with vomiting, unspecified: Secondary | ICD-10-CM | POA: Diagnosis not present

## 2023-01-23 LAB — CBC WITH DIFFERENTIAL/PLATELET
Abs Immature Granulocytes: 0.07 10*3/uL (ref 0.00–0.07)
Basophils Absolute: 0 10*3/uL (ref 0.0–0.1)
Basophils Relative: 0 %
Eosinophils Absolute: 0.2 10*3/uL (ref 0.0–0.5)
Eosinophils Relative: 1 %
HCT: 43.6 % (ref 39.0–52.0)
Hemoglobin: 15.7 g/dL (ref 13.0–17.0)
Immature Granulocytes: 1 %
Lymphocytes Relative: 7 %
Lymphs Abs: 1 10*3/uL (ref 0.7–4.0)
MCH: 26.7 pg (ref 26.0–34.0)
MCHC: 36 g/dL (ref 30.0–36.0)
MCV: 74.3 fL — ABNORMAL LOW (ref 80.0–100.0)
Monocytes Absolute: 1 10*3/uL (ref 0.1–1.0)
Monocytes Relative: 7 %
Neutro Abs: 13.1 10*3/uL — ABNORMAL HIGH (ref 1.7–7.7)
Neutrophils Relative %: 84 %
Platelets: 361 10*3/uL (ref 150–400)
RBC: 5.87 MIL/uL — ABNORMAL HIGH (ref 4.22–5.81)
RDW: 13.5 % (ref 11.5–15.5)
WBC: 15.3 10*3/uL — ABNORMAL HIGH (ref 4.0–10.5)
nRBC: 0 % (ref 0.0–0.2)

## 2023-01-23 LAB — COMPREHENSIVE METABOLIC PANEL
ALT: 23 U/L (ref 0–44)
AST: 29 U/L (ref 15–41)
Albumin: 4.1 g/dL (ref 3.5–5.0)
Alkaline Phosphatase: 36 U/L — ABNORMAL LOW (ref 38–126)
Anion gap: 9 (ref 5–15)
BUN: 21 mg/dL — ABNORMAL HIGH (ref 6–20)
CO2: 24 mmol/L (ref 22–32)
Calcium: 8.8 mg/dL — ABNORMAL LOW (ref 8.9–10.3)
Chloride: 105 mmol/L (ref 98–111)
Creatinine, Ser: 0.89 mg/dL (ref 0.61–1.24)
GFR, Estimated: >60 mL/min (ref >60)
Glucose, Bld: 170 mg/dL — ABNORMAL HIGH (ref 70–99)
Potassium: 4.7 mmol/L (ref 3.5–5.1)
Sodium: 138 mmol/L (ref 135–145)
Total Bilirubin: 1 mg/dL (ref 0.0–1.2)
Total Protein: 7.9 g/dL (ref 6.5–8.1)

## 2023-01-23 LAB — LIPASE, BLOOD: Lipase: 33 U/L (ref 11–51)

## 2023-01-23 LAB — RESP PANEL BY RT-PCR (RSV, FLU A&B, COVID)  RVPGX2
Influenza A by PCR: NEGATIVE
Influenza B by PCR: NEGATIVE
Resp Syncytial Virus by PCR: NEGATIVE
SARS Coronavirus 2 by RT PCR: NEGATIVE

## 2023-01-23 MED ORDER — ONDANSETRON HCL 4 MG/2ML IJ SOLN
4.0000 mg | Freq: Once | INTRAMUSCULAR | Status: AC
  Administered 2023-01-23: 4 mg via INTRAVENOUS
  Filled 2023-01-23: qty 2

## 2023-01-23 MED ORDER — ONDANSETRON 4 MG PO TBDP: 4.0000 mg | ORAL_TABLET | Freq: Three times a day (TID) | ORAL | 0 refills | Status: AC | PRN

## 2023-01-23 MED ORDER — SODIUM CHLORIDE 0.9 % IV BOLUS
1000.0000 mL | Freq: Once | INTRAVENOUS | Status: AC
  Administered 2023-01-23: 1000 mL via INTRAVENOUS

## 2023-01-23 MED ORDER — KETOROLAC TROMETHAMINE 30 MG/ML IJ SOLN
15.0000 mg | Freq: Once | INTRAMUSCULAR | Status: AC
  Administered 2023-01-23: 15 mg via INTRAVENOUS
  Filled 2023-01-23: qty 1

## 2023-01-23 NOTE — ED Provider Notes (Signed)
[facility] Provider Note    Event Date/Time   First MD Initiated Contact with Patient 01/23/23 1124     (approximate)  History   Chief Complaint: Emesis  HPI  Ryan Beck is a 43 y.o. male with a past medical history of asthma who presents the emergency department for nausea vomiting diarrhea starting yesterday.  According to the patient since yesterday he has been experiencing nausea vomiting and diarrhea, he relates this to possible chicken tenders he ate the day before.  No fever.  No focal abdominal pain but does states mild abdominal cramping.  Physical Exam   Triage Vital Signs: ED Triage Vitals [01/23/23 1002]  Encounter Vitals Group     BP 115/84     Systolic BP Percentile      Diastolic BP Percentile      Pulse Rate (!) 101     Resp 18     Temp 99.3 F (37.4 C)     Temp Source Oral     SpO2 98 %     Weight 251 lb 5.2 oz (114 kg)     Height 5\' 6"  (1.676 m)     Head Circumference      Peak Flow      Pain Score 0     Pain Loc      Pain Education      Exclude from Growth Chart     Most recent vital signs: Vitals:   01/23/23 1002  BP: 115/84  Pulse: (!) 101  Resp: 18  Temp: 99.3 F (37.4 C)  SpO2: 98%    General: Awake, no distress.  CV:  Good peripheral perfusion.  Regular rate and rhythm  Resp:  Normal effort.  Equal breath sounds bilaterally.  Abd:  No distention.  Soft, nontender.  No rebound or guarding  ED Results / Procedures / Treatments   MEDICATIONS ORDERED IN ED: Medications  ondansetron (ZOFRAN) injection 4 mg (has no administration in time range)  sodium chloride 0.9 % bolus 1,000 mL (has no administration in time range)  ketorolac (TORADOL) 30 MG/ML injection 15 mg (has no administration in time range)     IMPRESSION / MDM / ASSESSMENT AND PLAN / ED COURSE  I reviewed the triage vital signs and the nursing notes.  Patient's presentation is most consistent with acute presentation with potential threat  to life or bodily function.  Patient presents emergency department for nausea vomiting diarrhea starting yesterday.  Patient states he has been having trouble keeping fluids down due to the nausea, frequent episodes of diarrhea.  No significant abdominal pain.  Vital signs are reassuring mild tachycardia patient does have somewhat dry appearing mucous membranes.  Patient is workup so far shows a reassuring CBC with slight leukocytosis 15,000, respiratory panel is negative.  Chemistry and lipase are pending.  Patient symptoms are very suggestive of gastroenteritis likely norovirus given the recent outbreak.  We will treat with IV fluids Zofran Toradol.  We will continue to closely monitor while awaiting further lab results.  Patient agreeable to plan of care.  Patient's lab work is reassuring reassuring chemistry, reassuring CBC with slight leukocytosis normal LFTs, normal lipase, negative respiratory panel.  Highly suspect gastroenteritis.  Patient is feeling better after medications.  Will discharge with nausea medication.  Discussed supportive care at home including plenty of fluids.  Patient agreeable to plan.  FINAL CLINICAL IMPRESSION(S) / ED DIAGNOSES   Viral gastroenteritis  Rx / DC Orders   Zofran  Note:  This document was prepared using Dragon voice recognition software and may include unintentional dictation errors.   Minna Antis, MD 01/23/23 1404

## 2023-01-23 NOTE — ED Notes (Signed)
Pt given water 

## 2023-01-23 NOTE — ED Provider Triage Note (Signed)
Emergency Medicine Provider Triage Evaluation Note  Ryan Beck , a 43 y.o. male  was evaluated in triage.  Pt complains of n/v/d, started overnight, unknown if fever.  Review of Systems  Positive:  Negative:   Physical Exam  There were no vitals taken for this visit. Gen:   Awake, no distress   Resp:  Normal effort  MSK:   Moves extremities without difficulty  Other:    Medical Decision Making  Medically screening exam initiated at 9:59 AM.  Appropriate orders placed.  Ryan Beck was informed that the remainder of the evaluation will be completed by another provider, this initial triage assessment does not replace that evaluation, and the importance of remaining in the ED until their evaluation is complete.     Faythe Ghee, PA-C 01/23/23 1000

## 2023-01-23 NOTE — ED Notes (Signed)
Lab contacted x2 for labs

## 2023-01-23 NOTE — ED Triage Notes (Signed)
Pt states N/V/D and states possible food poisoning from chicken tenders from [employer]. NAD noted.
